# Patient Record
Sex: Male | Born: 1965 | Race: White | Hispanic: No | Smoking: Former smoker
Health system: Southern US, Community
[De-identification: ages and names within clinical notes are randomized; demographics above are authoritative.]

## PROBLEM LIST (undated history)

## (undated) DIAGNOSIS — Z972 Presence of dental prosthetic device (complete) (partial): Secondary | ICD-10-CM

## (undated) DIAGNOSIS — M5126 Other intervertebral disc displacement, lumbar region: Secondary | ICD-10-CM

## (undated) DIAGNOSIS — J329 Chronic sinusitis, unspecified: Secondary | ICD-10-CM

## (undated) DIAGNOSIS — M779 Enthesopathy, unspecified: Secondary | ICD-10-CM

## (undated) DIAGNOSIS — M5412 Radiculopathy, cervical region: Secondary | ICD-10-CM

## (undated) DIAGNOSIS — K219 Gastro-esophageal reflux disease without esophagitis: Secondary | ICD-10-CM

## (undated) DIAGNOSIS — F419 Anxiety disorder, unspecified: Secondary | ICD-10-CM

## (undated) DIAGNOSIS — M1990 Unspecified osteoarthritis, unspecified site: Secondary | ICD-10-CM

## (undated) HISTORY — PX: MICROLARYNGOSCOPY: SHX5208

## (undated) HISTORY — DX: Radiculopathy, cervical region: M54.12

## (undated) HISTORY — PX: WISDOM TOOTH EXTRACTION: SHX21

## (undated) HISTORY — DX: Enthesopathy, unspecified: M77.9

---

## 2011-08-19 HISTORY — PX: UPPER GASTROINTESTINAL ENDOSCOPY: SHX188

## 2015-07-27 ENCOUNTER — Other Ambulatory Visit: Payer: Self-pay | Admitting: Chiropractic Medicine

## 2015-07-27 DIAGNOSIS — M5126 Other intervertebral disc displacement, lumbar region: Secondary | ICD-10-CM

## 2015-07-31 ENCOUNTER — Ambulatory Visit (HOSPITAL_COMMUNITY): Admission: RE | Admit: 2015-07-31 | Payer: BLUE CROSS/BLUE SHIELD | Source: Ambulatory Visit

## 2015-08-01 ENCOUNTER — Encounter: Payer: Self-pay | Admitting: *Deleted

## 2015-08-07 NOTE — Discharge Instructions (Signed)
[facility] °[facility] °ENDOSCOPIC SINUS SURGERY °[facility] ° °What is Functional Endoscopic Sinus Surgery? ° The Surgery involves making the natural openings of the sinuses larger by removing the bony partitions that separate the sinuses from the nasal cavity.  The natural sinus lining is preserved as much as possible to allow the sinuses to resume normal function after the surgery.  In some patients nasal polyps (excessively swollen lining of the sinuses) may be removed to relieve obstruction of the sinus openings.  The surgery is performed through the nose using lighted scopes, which eliminates the need for incisions on the face.  A septoplasty is a different procedure which is sometimes performed with sinus surgery.  It involves straightening the boy partition that separates the two sides of your nose.  A crooked or deviated septum may need repair if is obstructing the sinuses or nasal airflow.  Turbinate reduction is also often performed during sinus surgery.  The turbinates are bony proturberances from the side walls of the nose which swell and can obstruct the nose in patients with sinus and allergy problems.  Their size can be surgically reduced to help relieve nasal obstruction. ° °What Can Sinus Surgery Do For Me? ° Sinus surgery can reduce the frequency of sinus infections requiring antibiotic treatment.  This can provide improvement in nasal congestion, post-nasal drainage, facial pressure and nasal obstruction.  Surgery will NOT prevent you from ever having an infection again, so it usually only for patients who get infections 4 or more times yearly requiring antibiotics, or for infections that do not clear with antibiotics.  It will not cure nasal allergies, so patients with allergies may still require medication to treat their allergies after surgery. Surgery may improve headaches related to sinusitis, however, some people will continue to  require medication to control sinus headaches related to allergies.  Surgery will do nothing for other forms of headache (migraine, tension or cluster). ° °What Are the Risks of Endoscopic Sinus Surgery? ° Current techniques allow surgery to be performed safely with little risk, however, there are rare complications that patients should be aware of.  Because the sinuses are located around the eyes, there is risk of eye injury, including blindness, though again, this would be quite rare. This is usually a result of bleeding behind the eye during surgery, which puts the vision oat risk, though there are treatments to protect the vision and prevent permanent disrupted by surgery causing a leak of the spinal fluid that surrounds the brain.  More serious complications would include bleeding inside the brain cavity or damage to the brain.  Again, all of these complications are uncommon, and spinal fluid leaks can be safely managed surgically if they occur.  The most common complication of sinus surgery is bleeding from the nose, which may require packing or cauterization of the nose.  Continued sinus have polyps may experience recurrence of the polyps requiring revision surgery.  Alterations of sense of smell or injury to the tear ducts are also rare complications.  ° °What is the Surgery Like, and what is the Recovery? ° The Surgery usually takes a couple of hours to perform, and is usually performed under a general anesthetic (completely asleep).  Patients are usually discharged home after a couple of hours.  Sometimes during surgery it is necessary to pack the nose to control bleeding, and the packing is left in place for 24 - 48 hours, and removed by your surgeon.    If a septoplasty was performed during the procedure, there is often a splint placed which must be removed after 5-7 days.   °Discomfort: Pain is usually mild to moderate, and can be controlled by prescription pain medication or acetaminophen (Tylenol).   Aspirin, Ibuprofen (Advil, Motrin), or Naprosyn (Aleve) should be avoided, as they can cause increased bleeding.  Most patients feel sinus pressure like they have a bad head cold for several days.  Sleeping with your head elevated can help reduce swelling and facial pressure, as can ice packs over the face.  A humidifier may be helpful to keep the mucous and blood from drying in the nose.  ° °Diet: There are no specific diet restrictions, however, you should generally start with clear liquids and a light diet of bland foods because the anesthetic can cause some nausea.  Advance your diet depending on how your stomach feels.  Taking your pain medication with food will often help reduce stomach upset which pain medications can cause. ° °Nasal Saline Irrigation: It is important to remove blood clots and dried mucous from the nose as it is healing.  This is done by having you irrigate the nose at least 3 - 4 times daily with a salt water solution.  We recommend using NeilMed Sinus Rinse (available at the drug store).  Fill the squeeze bottle with the solution, bend over a sink, and insert the tip of the squeeze bottle into the nose ½ of an inch.  Point the tip of the squeeze bottle towards the inside corner of the eye on the same side your irrigating.  Squeeze the bottle and gently irrigate the nose.  If you bend forward as you do this, most of the fluid will flow back out of the nose, instead of down your throat.   The solution should be warm, near body temperature, when you irrigate.   Each time you irrigate, you should use a full squeeze bottle.  ° °Note that if you are instructed to use Nasal Steroid Sprays at any time after your surgery, irrigate with saline BEFORE using the steroid spray, so you do not wash it all out of the nose. °Another product, Nasal Saline Gel (such as AYR Nasal Saline Gel) can be applied in each nostril 3 - 4 times daily to moisture the nose and reduce scabbing or crusting. ° °Bleeding:   Bloody drainage from the nose can be expected for several days, and patients are instructed to irrigate their nose frequently with salt water to help remove mucous and blood clots.  The drainage may be dark red or brown, though some fresh blood may be seen intermittently, especially after irrigation.  Do not blow you nose, as bleeding may occur. If you must sneeze, keep your mouth open to allow air to escape through your mouth. ° °If heavy bleeding occurs: Irrigate the nose with saline to rinse out clots, then spray the nose 3 - 4 times with Afrin Nasal Decongestant Spray.  The spray will constrict the blood vessels to slow bleeding.  Pinch the lower half of your nose shut to apply pressure, and lay down with your head elevated.  Ice packs over the nose may help as well. If bleeding persists despite these measures, you should notify your doctor.  Do not use the Afrin routinely to control nasal congestion after surgery, as it can result in worsening congestion and may affect healing.  ° ° ° °Activity: Return to work varies among patients. Most patients will be   out of work at least 5 - 7 days to recover.  Patient may return to work after they are off of narcotic pain medication, and feeling well enough to perform the functions of their job.  Patients must avoid heavy lifting (over 10 pounds) or strenuous physical for 2 weeks after surgery, so your employer may need to assign you to light duty, or keep you out of work longer if light duty is not possible.  NOTE: you should not drive, operate dangerous machinery, do any mentally demanding tasks or make any important legal or financial decisions while on narcotic pain medication and recovering from the general anesthetic.  °  °Call Your Doctor Immediately if You Have Any of the Following: °1. Bleeding that you cannot control with the above measures °2. Loss of vision, double vision, bulging of the eye or black eyes. °3. Fever over 101 degrees °4. Neck stiffness with  severe headache, fever, nausea and change in mental state. °You are always encourage to call anytime with concerns, however, please call with requests for pain medication refills during office hours. ° °Office Endoscopy: During follow-up visits your doctor will remove any packing or splints that may have been placed and evaluate and clean your sinuses endoscopically.  Topical anesthetic will be used to make this as comfortable as possible, though you may want to take your pain medication prior to the visit.  How often this will need to be done varies from patient to patient.  After complete recovery from the surgery, you may need follow-up endoscopy from time to time, particularly if there is concern of recurrent infection or nasal polyps. ° °General Anesthesia, Adult, Care After °Refer to this sheet in the next few weeks. These instructions provide you with information on caring for yourself after your procedure. Your health care provider may also give you more specific instructions. Your treatment has been planned according to current medical practices, but problems sometimes occur. Call your health care provider if you have any problems or questions after your procedure. °WHAT TO EXPECT AFTER THE PROCEDURE °After the procedure, it is typical to experience: °· Sleepiness. °· Nausea and vomiting. °HOME CARE INSTRUCTIONS °· For the first 24 hours after general anesthesia: °¨ Have a responsible person with you. °¨ Do not drive a car. If you are alone, do not take public transportation. °¨ Do not drink alcohol. °¨ Do not take medicine that has not been prescribed by your health care provider. °¨ Do not sign important papers or make important decisions. °¨ You may resume a normal diet and activities as directed by your health care provider. °· Change bandages (dressings) as directed. °· If you have questions or problems that seem related to general anesthesia, call the hospital and ask for the anesthetist or  anesthesiologist on call. °SEEK MEDICAL CARE IF: °· You have nausea and vomiting that continue the day after anesthesia. °· You develop a rash. °SEEK IMMEDIATE MEDICAL CARE IF:  °· You have difficulty breathing. °· You have chest pain. °· You have any allergic problems. °  °This information is not intended to replace advice given to you by your health care provider. Make sure you discuss any questions you have with your health care provider. °  °Document Released: 11/10/2000 Document Revised: 08/25/2014 Document Reviewed: 12/03/2011 °Elsevier Interactive Patient Education ©2016 [employer] ° °

## 2015-08-08 ENCOUNTER — Ambulatory Visit: Payer: BLUE CROSS/BLUE SHIELD | Admitting: Anesthesiology

## 2015-08-08 ENCOUNTER — Ambulatory Visit
Admission: RE | Admit: 2015-08-08 | Discharge: 2015-08-08 | Disposition: A | Discharge status: Home / Self Care | Payer: BLUE CROSS/BLUE SHIELD | Source: Ambulatory Visit | Attending: Otolaryngology | Admitting: Otolaryngology

## 2015-08-08 ENCOUNTER — Encounter
Admission: RE | Disposition: A | Discharge status: Home / Self Care | Payer: Self-pay | Source: Ambulatory Visit | Attending: Otolaryngology

## 2015-08-08 ENCOUNTER — Encounter: Payer: Self-pay | Admitting: *Deleted

## 2015-08-08 DIAGNOSIS — J329 Chronic sinusitis, unspecified: Secondary | ICD-10-CM | POA: Diagnosis not present

## 2015-08-08 DIAGNOSIS — J342 Deviated nasal septum: Secondary | ICD-10-CM | POA: Insufficient documentation

## 2015-08-08 DIAGNOSIS — J343 Hypertrophy of nasal turbinates: Secondary | ICD-10-CM | POA: Insufficient documentation

## 2015-08-08 DIAGNOSIS — K219 Gastro-esophageal reflux disease without esophagitis: Secondary | ICD-10-CM | POA: Insufficient documentation

## 2015-08-08 DIAGNOSIS — J338 Other polyp of sinus: Secondary | ICD-10-CM | POA: Diagnosis not present

## 2015-08-08 DIAGNOSIS — J3489 Other specified disorders of nose and nasal sinuses: Secondary | ICD-10-CM | POA: Diagnosis not present

## 2015-08-08 DIAGNOSIS — F17200 Nicotine dependence, unspecified, uncomplicated: Secondary | ICD-10-CM | POA: Diagnosis not present

## 2015-08-08 HISTORY — DX: Gastro-esophageal reflux disease without esophagitis: K21.9

## 2015-08-08 HISTORY — DX: Presence of dental prosthetic device (complete) (partial): Z97.2

## 2015-08-08 HISTORY — PX: MAXILLARY ANTROSTOMY: SHX2003

## 2015-08-08 HISTORY — PX: FRONTAL SINUS EXPLORATION: SHX6591

## 2015-08-08 HISTORY — PX: ETHMOIDECTOMY: SHX5197

## 2015-08-08 HISTORY — PX: NASAL TURBINATE REDUCTION: SHX2072

## 2015-08-08 HISTORY — DX: Unspecified osteoarthritis, unspecified site: M19.90

## 2015-08-08 HISTORY — PX: IMAGE GUIDED SINUS SURGERY: SHX6570

## 2015-08-08 HISTORY — PX: SPHENOIDECTOMY: SHX2421

## 2015-08-08 HISTORY — PX: SEPTOPLASTY: SHX2393

## 2015-08-08 SURGERY — SINUS SURGERY, WITH IMAGING GUIDANCE
Anesthesia: General | Site: Nose | Wound class: Clean Contaminated

## 2015-08-08 MED ORDER — HYDROMORPHONE HCL 1 MG/ML IJ SOLN: 0.2500 mg | INTRAMUSCULAR | Status: DC | PRN

## 2015-08-08 MED ORDER — ACETAMINOPHEN 325 MG PO TABS: 325.0000 mg | ORAL_TABLET | ORAL | Status: DC | PRN

## 2015-08-08 MED ORDER — SUCCINYLCHOLINE CHLORIDE 20 MG/ML IJ SOLN
INTRAMUSCULAR | Status: DC | PRN
  Administered 2015-08-08: 100 mg via INTRAVENOUS

## 2015-08-08 MED ORDER — ACETAMINOPHEN 160 MG/5ML PO SOLN: 325.0000 mg | ORAL | Status: DC | PRN

## 2015-08-08 MED ORDER — HYDROCODONE-ACETAMINOPHEN 5-325 MG PO TABS: 1.0000 | ORAL_TABLET | ORAL | Status: DC | PRN

## 2015-08-08 MED ORDER — GLYCOPYRROLATE 0.2 MG/ML IJ SOLN
INTRAMUSCULAR | Status: DC | PRN
  Administered 2015-08-08: .1 mg via INTRAVENOUS

## 2015-08-08 MED ORDER — LIDOCAINE-EPINEPHRINE 1 %-1:100000 IJ SOLN
INTRAMUSCULAR | Status: DC | PRN
  Administered 2015-08-08: 7 mL

## 2015-08-08 MED ORDER — ACETAMINOPHEN 10 MG/ML IV SOLN
1000.0000 mg | Freq: Once | INTRAVENOUS | Status: AC
  Administered 2015-08-08: 1000 mg via INTRAVENOUS

## 2015-08-08 MED ORDER — DEXAMETHASONE SODIUM PHOSPHATE 4 MG/ML IJ SOLN
INTRAMUSCULAR | Status: DC | PRN
  Administered 2015-08-08: 10 mg via INTRAVENOUS

## 2015-08-08 MED ORDER — CEFAZOLIN SODIUM-DEXTROSE 2-3 GM-% IV SOLR
2.0000 g | Freq: Once | INTRAVENOUS | Status: AC
  Administered 2015-08-08: 2 g via INTRAVENOUS

## 2015-08-08 MED ORDER — MIDAZOLAM HCL 5 MG/5ML IJ SOLN
INTRAMUSCULAR | Status: DC | PRN
  Administered 2015-08-08: 2 mg via INTRAVENOUS

## 2015-08-08 MED ORDER — SULFAMETHOXAZOLE-TRIMETHOPRIM 800-160 MG PO TABS: 1.0000 | ORAL_TABLET | Freq: Two times a day (BID) | ORAL | Status: DC

## 2015-08-08 MED ORDER — FENTANYL CITRATE (PF) 100 MCG/2ML IJ SOLN
INTRAMUSCULAR | Status: DC | PRN
  Administered 2015-08-08: 25 ug via INTRAVENOUS
  Administered 2015-08-08: 100 ug via INTRAVENOUS
  Administered 2015-08-08: 50 ug via INTRAVENOUS
  Administered 2015-08-08: 25 ug via INTRAVENOUS
  Administered 2015-08-08: 50 ug via INTRAVENOUS
  Administered 2015-08-08 (×2): 25 ug via INTRAVENOUS

## 2015-08-08 MED ORDER — ALBUTEROL SULFATE HFA 108 (90 BASE) MCG/ACT IN AERS
INHALATION_SPRAY | RESPIRATORY_TRACT | Status: DC | PRN
  Administered 2015-08-08: 2 via RESPIRATORY_TRACT

## 2015-08-08 MED ORDER — OXYCODONE HCL 5 MG/5ML PO SOLN: 5.0000 mg | Freq: Once | ORAL | Status: AC | PRN

## 2015-08-08 MED ORDER — ONDANSETRON HCL 4 MG/2ML IJ SOLN
4.0000 mg | Freq: Once | INTRAMUSCULAR | Status: AC | PRN
  Administered 2015-08-08: 4 mg via INTRAVENOUS

## 2015-08-08 MED ORDER — LACTATED RINGERS IV SOLN: 500.0000 mL | INTRAVENOUS | Status: DC

## 2015-08-08 MED ORDER — OXYMETAZOLINE HCL 0.05 % NA SOLN
NASAL | Status: DC | PRN
  Administered 2015-08-08: 1 via TOPICAL

## 2015-08-08 MED ORDER — PROMETHAZINE HCL 12.5 MG PO TABS: 12.5000 mg | ORAL_TABLET | Freq: Four times a day (QID) | ORAL | Status: DC | PRN

## 2015-08-08 MED ORDER — OXYCODONE HCL 5 MG PO TABS
5.0000 mg | ORAL_TABLET | Freq: Once | ORAL | Status: AC | PRN
  Administered 2015-08-08: 5 mg via ORAL

## 2015-08-08 MED ORDER — LACTATED RINGERS IV SOLN
INTRAVENOUS | Status: DC
  Administered 2015-08-08 (×2): via INTRAVENOUS

## 2015-08-08 MED ORDER — EPHEDRINE SULFATE 50 MG/ML IJ SOLN
INTRAMUSCULAR | Status: DC | PRN
  Administered 2015-08-08 (×2): 5 mg via INTRAVENOUS

## 2015-08-08 MED ORDER — ONDANSETRON HCL 4 MG/2ML IJ SOLN
INTRAMUSCULAR | Status: DC | PRN
  Administered 2015-08-08: 4 mg via INTRAVENOUS

## 2015-08-08 MED ORDER — LIDOCAINE HCL (CARDIAC) 20 MG/ML IV SOLN
INTRAVENOUS | Status: DC | PRN
  Administered 2015-08-08: 40 mg via INTRAVENOUS

## 2015-08-08 MED ORDER — ONDANSETRON 4 MG PO TBDP
4.0000 mg | ORAL_TABLET | Freq: Once | ORAL | Status: AC
  Administered 2015-08-08: 4 mg via ORAL

## 2015-08-08 MED ORDER — PROPOFOL 10 MG/ML IV BOLUS
INTRAVENOUS | Status: DC | PRN
  Administered 2015-08-08: 200 mg via INTRAVENOUS
  Administered 2015-08-08: 100 mg via INTRAVENOUS

## 2015-08-08 SURGICAL SUPPLY — 37 items
BALLOON SINUPLASTY SYSTEM (BALLOONS) ×4 IMPLANT
BATTERY INSTRU NAVIGATION (MISCELLANEOUS) ×16 IMPLANT
BLADE IRRIGATOR 40D CVD (IRRIGATION / IRRIGATOR) IMPLANT
CANISTER SUCT 1200ML W/VALVE (MISCELLANEOUS) ×4 IMPLANT
COAG SUCT 10F 3.5MM HAND CTRL (MISCELLANEOUS) ×4 IMPLANT
DEVICE INFLATION SEID (MISCELLANEOUS) ×4 IMPLANT
DRAPE HEAD BAR (DRAPES) ×4 IMPLANT
DRESSING NASL FOAM PST OP SINU (MISCELLANEOUS) ×3 IMPLANT
DRSG NASAL FOAM POST OP SINU (MISCELLANEOUS) ×4
GLOVE BIO SURGEON STRL SZ7.5 (GLOVE) ×12 IMPLANT
IRRIGATOR 4MM STR (IRRIGATION / IRRIGATOR) ×4 IMPLANT
IV NS 500ML (IV SOLUTION) ×1
IV NS 500ML BAXH (IV SOLUTION) ×3 IMPLANT
KIT ROOM TURNOVER OR (KITS) ×4 IMPLANT
NAVIGATION MASK REG  ST (MISCELLANEOUS) ×4 IMPLANT
NEEDLE HYPO 25GX1X1/2 BEV (NEEDLE) ×4 IMPLANT
NS IRRIG 500ML POUR BTL (IV SOLUTION) ×4 IMPLANT
PACK DRAPE NASAL/ENT (PACKS) ×4 IMPLANT
PACKING NASAL EPIS 4X2.4 XEROG (MISCELLANEOUS) ×8 IMPLANT
PAD GROUND ADULT SPLIT (MISCELLANEOUS) ×4 IMPLANT
PATTIES SURGICAL .5 X3 (DISPOSABLE) ×8 IMPLANT
SET HANDPIECE IRR DIEGO (MISCELLANEOUS) ×4 IMPLANT
SOL ANTI-FOG 6CC FOG-OUT (MISCELLANEOUS) ×3 IMPLANT
SOL FOG-OUT ANTI-FOG 6CC (MISCELLANEOUS) ×1
SPLINT NASAL SEPTAL PRE-CUT (MISCELLANEOUS) ×4 IMPLANT
STRAP BODY AND KNEE 60X3 (MISCELLANEOUS) ×8 IMPLANT
SUT CHROMIC 4 0 RB 1X27 (SUTURE) ×4 IMPLANT
SUT ETHILON 3-0 FS-10 30 BLK (SUTURE)
SUT ETHILON 4-0 (SUTURE)
SUT ETHILON 4-0 FS2 18XMFL BLK (SUTURE)
SUT PLAIN GUT 4-0 (SUTURE) ×4 IMPLANT
SUTURE EHLN 3-0 FS-10 30 BLK (SUTURE) IMPLANT
SUTURE ETHLN 4-0 FS2 18XMF BLK (SUTURE) IMPLANT
SYR BULB EAR ULCER 3OZ GRN STR (SYRINGE) ×4 IMPLANT
SYRINGE 10CC LL (SYRINGE) ×4 IMPLANT
TOWEL OR 17X26 4PK STRL BLUE (TOWEL DISPOSABLE) ×4 IMPLANT
WATER STERILE IRR 500ML POUR (IV SOLUTION) ×4 IMPLANT

## 2015-08-08 NOTE — Transfer of Care (Signed)
Immediate Anesthesia Transfer of Care Note  Patient: David Kennedy  Procedure(s) Performed: Procedure(s) with comments: IMAGE GUIDED SINUS SURGERY (N/A) - GAVE DISK TO CE[facility] 12/8 SEPTOPLASTY (N/A) MAXILLARY ANTROSTOMY (Bilateral) FRONTAL SINUS EXPLORATION (Left) TOTAL ETHMOIDECTOMY (Bilateral) SPHENOIDECTOMY (Left) TURBINATE REDUCTION/SUBMUCOSAL RESECTION (Bilateral)  Patient Location: PACU  Anesthesia Type: General  Level of Consciousness: awake, alert  and patient cooperative  Airway and Oxygen Therapy: Patient Spontanous Breathing and Patient connected to supplemental oxygen  Post-op Assessment: Post-op Vital signs reviewed, Patient's Cardiovascular Status Stable, Respiratory Function Stable, Patent Airway and No signs of Nausea or vomiting  Post-op Vital Signs: Reviewed and stable  Complications: No apparent anesthesia complications

## 2015-08-08 NOTE — H&P (Signed)
..  History and Physical paper copy reviewed and updated date of procedure and will be scanned into system.  

## 2015-08-08 NOTE — Anesthesia Preprocedure Evaluation (Signed)
Anesthesia Evaluation  Patient identified by MRN, date of birth, ID band Patient awake    Reviewed: Allergy & Precautions, H&P , NPO status , Patient's Chart, lab work & pertinent test results, reviewed documented beta blocker date and time   History of Anesthesia Complications Negative for: history of anesthetic complications  Airway Mallampati: II  TM Distance: >3 FB Neck ROM: full    Dental no notable dental hx.    Pulmonary Current Smoker,    Pulmonary exam normal breath sounds clear to auscultation       Cardiovascular Exercise Tolerance: Good negative cardio ROS   Rhythm:regular Rate:Normal     Neuro/Psych negative neurological ROS  negative psych ROS   GI/Hepatic Neg liver ROS, GERD  Medicated,  Endo/Other  negative endocrine ROS  Renal/GU negative Renal ROS  negative genitourinary   Musculoskeletal   Abdominal   Peds  Hematology negative hematology ROS (+)   Anesthesia Other Findings   Reproductive/Obstetrics negative OB ROS                             Anesthesia Physical Anesthesia Plan  ASA: II  Anesthesia Plan: General   Post-op Pain Management:    Induction:   Airway Management Planned:   Additional Equipment:   Intra-op Plan:   Post-operative Plan:   Informed Consent: I have reviewed the patients History and Physical, chart, labs and discussed the procedure including the risks, benefits and alternatives for the proposed anesthesia with the patient or authorized representative who has indicated his/her understanding and acceptance.     Plan Discussed with: CRNA  Anesthesia Plan Comments:         Anesthesia Quick Evaluation

## 2015-08-08 NOTE — Anesthesia Postprocedure Evaluation (Signed)
Anesthesia Post Note  Patient: Beatrix Fetters  Procedure(s) Performed: Procedure(s) (LRB): IMAGE GUIDED SINUS SURGERY (N/A) SEPTOPLASTY (N/A) MAXILLARY ANTROSTOMY (Bilateral) FRONTAL SINUS EXPLORATION (Left) TOTAL ETHMOIDECTOMY (Bilateral) SPHENOIDECTOMY (Left) TURBINATE REDUCTION/SUBMUCOSAL RESECTION (Bilateral)  Patient location during evaluation: PACU Anesthesia Type: General Level of consciousness: awake and alert Pain management: pain level controlled Vital Signs Assessment: post-procedure vital signs reviewed and stable Respiratory status: spontaneous breathing, nonlabored ventilation, respiratory function stable and patient connected to nasal cannula oxygen Cardiovascular status: blood pressure returned to baseline and stable Postop Assessment: no signs of nausea or vomiting Anesthetic complications: no    DANIEL D KOVACS

## 2015-08-08 NOTE — Addendum Note (Signed)
Addendum  created 08/08/15 1302 by Mayme Genta, CRNA   Modules edited: Anesthesia Medication Administration

## 2015-08-08 NOTE — Addendum Note (Signed)
Addendum  created 08/08/15 1402 by Esmeralda Links, MD   Modules edited: Orders

## 2015-08-08 NOTE — Anesthesia Procedure Notes (Addendum)
Procedure Name: Intubation Date/Time: 08/08/2015 9:35 AM Performed by: Mayme Genta Pre-anesthesia Checklist: Patient identified, Emergency Drugs available, Suction available, Patient being monitored and Timeout performed Patient Re-evaluated:Patient Re-evaluated prior to inductionOxygen Delivery Method: Circle system utilized Preoxygenation: Pre-oxygenation with 100% oxygen Intubation Type: IV induction Ventilation: Mask ventilation without difficulty Laryngoscope Size: Miller and 3 Grade View: Grade I Tube type: Oral Rae Tube size: 7.5 mm Number of attempts: 1 Placement Confirmation: ETT inserted through vocal cords under direct vision,  positive ETCO2 and breath sounds checked- equal and bilateral Tube secured with: Tape Dental Injury: Teeth and Oropharynx as per pre-operative assessment

## 2015-08-08 NOTE — Progress Notes (Signed)
Pt vomited approx 264ml of blood tinged fluid. Pt already received a total of 8mg  of zofran, Dr. Charlestine Night aware, MD verbalizes he would like to give another 4mg  of po zofran. Will admin & continue to monitor.

## 2015-08-08 NOTE — Op Note (Signed)
..08/08/2015  12:39 PM    Beatrix Fetters  [id number]    Pre-Op Dx:  Deviated Nasal Septum, Hypertrophic Inferior Turbinates, Chronic Sinusitis, Nasal Obstruction  Post-op Dx: Same  Proc: 1)    Image Guidance Sinus Surgery  2)  Bilateral Maxillary Antrostomy  3)  Bilateral Total Ethmoidectomy  4)  Left Frontal Sinusotomy  5)  Left Sphenoidotomy  6)  Nasal Septoplasty  7)  Bilateral Partial Reduction Inferior Turbinates   Surg:  Keyasia Jolliff  Anes:  GOT  EBL:  75  Comp:  None  Findings: Severe bilateral septal spurs with left spur impaction onto posterior inferior turbinate, Bilateral wide maxillary antrostomies, bilateral total ethmoidectomies with 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  The patient received preoperative Afrin spray for topical decongestion and vasoconstriction.  At an appropriate level, the patient was placed in a semi-sitting position.  Nasal vibrissae were trimmed.   1% Xylocaine with 1:100,000 epinephrine, 5 cc's, was infiltrated into the anterior floor of the nose, into the nasal spine region, into the membranous columella, and finally into the submucoperichondrial plane of the septum on both sides.  Several minutes were allowed for this to take effect.  Cottoniod pledgetts soaked in Afrin were placed into both nasal cavities and left while the patient was prepped and draped in the standard fashion.   A proper time-out was performed.  The Stryker image guidance system was set up and calibrated in the normal fashion with an acceptable error of 0.68mm.   The materials were removed from the nose and observed to be intact and correct in number.  The nose was inspected with a headlight and zero degree endoscope with the findings as described above.  The inferior turbinates were then  inspected.  Under endoscopic visualization, the inferior turbinates were infractured bilaterally with a Soil scientist.  A kelly clamp was attached to the anterior-inferior third of each inferior turbinate for approximately one minute.  Under endoscopic visualization, Tru-cutting forceps were used to remove the anterior-inferior third of each inferior turbinate.  Electrocautery was used to control bleeding in the area. The remaining turbinate was then outfractured to open up the airway further. There was no significant bleeding noted. The right turbinate was then trimmed and outfractured in a similar fashion.     At this point, attention was directed to the functional endoscopic sinus surgery aspect of the procedure.  The nose was next inspected with a zero degree endoscope and the middle turbinates were medialized and afrin soaked pledgets were placed lateral to the turbinates for approximately one minute.  The uncinate process was infractured with a maxillary ostia seeker.  The Acclarent balloon sinuplasty device was next placed behind the uncinate process and the guide wire was threaded into the left frontal sinus with proper transillumination.  The sinus tract was then dilated 3X for a patent frontal sinus.  At this time a currette and Giraffe probe was used to remove the fractured ager nasi cells for a widely patient frontal sinus outflow tract.  At this time attention was directed to the patient's maxillary sinuses.  On the left, a ball tipped probe was placed through the natural ostia and this was used to create a larger opening.  Using a Diego microdebrider, the maxillary antrostomy was enlarged for a widely patent maxillary antrostomy.  This was repeated in a simlar fashion on the patient's  right side.  Hemostasis was performed with topical Afrin soaked pledgets.  Visualization with a 30 degree endoscope was used to examine the bilateral maxillary antrostomies which were noted to be widely patent and  in continuity with the natural os bilaterally.  Next attention was directed to the patient's ethmoid sinuses.  The left ethmoid bulla was entered with a straight image guidance suction.  The ethmoid cells were opened from a medial and inferior position superior and laterally until the vertical lamella was encountered.  This was opened and the posterior ethmoid was opened in a similar fashion.  All fragments of bone and mucosa were removed with straight biting forceps.  The skull base was identified and trauma was avoided.  Image guidance was used throughout to ensure all diseased air cells were opened.  This was repeated on the patient's right side in a similar fashion with all ethmoid air cells opened bilaterally.  Attention at this time was directed to the patient's sphenoid sinuses.  On the patient's left side, the middle turbinate was lateralized and the superior turbinate was identified.  The sphenoid os was visualized and the os was sequentially dilated with an acclarent balloon device and then elarged with 45 degree biting forceps and mushroom punch.  Polypoid mucosa was noted near the os.  At this time all diseased sinuses were opened and attention was directed to the patient's septoplasty.  A left Killian incision was sharply executed and carried down to the caudal edge of the quadrangular cartilage with a 15 blade scapel.  A mucoperichondrial flap was elelvated along the quadrangular plate back to the bony-cartilaginous junction using caudal elevator and freer elevator. The mucoperiostium was then elevated along the ethmoid plate and the vomer. An itracartilagenous incision was made using the freer elevator and a contralateral mucoperichondiral flap was elevated using a freer elevator.  Care was taken to avoid any large rents or opposing rents in the mucoperichondrial flap.  Boney spurs of the vomer and maxillary crest were removed with Takahashi forceps.  The area of cartilagenous deviation was  removed with combination of freer elevator and Takahashi forceps creating a widely patent nasal cavity as well as resolution of obstruction from the cartilagenous deviation. The mucosal flaps were placed back into their anatomic position to allow visualization of the airways. The septum now sat in the midline with an improved airway.  A 4-0 Chromic was used to close the [place] incision as well.   At this time with all sinuses opened, the patient's nasal cavity was examinated and copiously irrigated with sterile saline.  Meticulous hemostasis was continued and all sinuses were examined and noted to be widely patent.    There was no signifcant bleeding. Nasal splints were applied to both sides of the septum using Xomed 0.65mm regular sized splints that were trimmed, and then held in position with a 3-0 Nylon through and through suture.  Stamberger sinufoam was placed along the cut edge of the inferior turbinates bilaterally as well as into posterior ethmoids.  Xerogel was placed lateral to the middle turbinate bilateral for proper medialization.  Stamberger was used to inflate the xerogel bilaterally.  The patient was turned back over to anesthesia, and awakened, extubated, and taken to the PACU in satisfactory condition.  Dispo:   PACU to home  Plan: Ice, elevation, narcotic analgesia, steroid taper, and prophylactic antibiotics for the duration of indwelling nasal foreign bodies.  We will reevaluate the patient in the office in 7 days and remove the septal  splints.  Return to work in 10 days, strenuous activities in two weeks.   Alonnie Bieker 08/08/2015 12:39 PM

## 2015-08-09 ENCOUNTER — Encounter: Payer: Self-pay | Admitting: Otolaryngology

## 2015-08-10 LAB — SURGICAL PATHOLOGY

## 2015-10-24 ENCOUNTER — Ambulatory Visit: Payer: BLUE CROSS/BLUE SHIELD | Attending: Otolaryngology

## 2015-10-24 DIAGNOSIS — F5101 Primary insomnia: Secondary | ICD-10-CM | POA: Insufficient documentation

## 2015-10-24 DIAGNOSIS — R0683 Snoring: Secondary | ICD-10-CM | POA: Insufficient documentation

## 2015-12-31 DIAGNOSIS — J209 Acute bronchitis, unspecified: Secondary | ICD-10-CM | POA: Diagnosis not present

## 2016-04-14 DIAGNOSIS — R030 Elevated blood-pressure reading, without diagnosis of hypertension: Secondary | ICD-10-CM | POA: Diagnosis not present

## 2016-04-14 DIAGNOSIS — J9801 Acute bronchospasm: Secondary | ICD-10-CM | POA: Diagnosis not present

## 2016-04-14 DIAGNOSIS — J069 Acute upper respiratory infection, unspecified: Secondary | ICD-10-CM | POA: Diagnosis not present

## 2016-05-25 DIAGNOSIS — J209 Acute bronchitis, unspecified: Secondary | ICD-10-CM | POA: Diagnosis not present

## 2016-05-25 DIAGNOSIS — F17210 Nicotine dependence, cigarettes, uncomplicated: Secondary | ICD-10-CM | POA: Diagnosis not present

## 2016-05-30 DIAGNOSIS — J189 Pneumonia, unspecified organism: Secondary | ICD-10-CM | POA: Diagnosis not present

## 2016-07-05 ENCOUNTER — Encounter: Payer: Self-pay | Admitting: Internal Medicine

## 2016-07-07 ENCOUNTER — Ambulatory Visit (INDEPENDENT_AMBULATORY_CARE_PROVIDER_SITE_OTHER): Payer: BLUE CROSS/BLUE SHIELD | Admitting: Internal Medicine

## 2016-07-07 ENCOUNTER — Encounter: Payer: Self-pay | Admitting: Internal Medicine

## 2016-07-07 ENCOUNTER — Other Ambulatory Visit: Payer: Self-pay | Admitting: Internal Medicine

## 2016-07-07 ENCOUNTER — Ambulatory Visit
Admission: RE | Admit: 2016-07-07 | Discharge: 2016-07-07 | Disposition: A | Discharge status: Home / Self Care | Payer: BLUE CROSS/BLUE SHIELD | Source: Ambulatory Visit | Attending: Internal Medicine | Admitting: Internal Medicine

## 2016-07-07 VITALS — BP 138/72 | HR 74 | Temp 98.2°F | Resp 16 | Ht 69.0 in | Wt 236.0 lb

## 2016-07-07 DIAGNOSIS — F17200 Nicotine dependence, unspecified, uncomplicated: Secondary | ICD-10-CM | POA: Diagnosis not present

## 2016-07-07 DIAGNOSIS — J189 Pneumonia, unspecified organism: Secondary | ICD-10-CM

## 2016-07-07 DIAGNOSIS — K219 Gastro-esophageal reflux disease without esophagitis: Secondary | ICD-10-CM | POA: Insufficient documentation

## 2016-07-07 DIAGNOSIS — F411 Generalized anxiety disorder: Secondary | ICD-10-CM | POA: Insufficient documentation

## 2016-07-07 MED ORDER — PAROXETINE HCL ER 25 MG PO TB24: 25.0000 mg | ORAL_TABLET | Freq: Every day | ORAL | 5 refills | Status: DC

## 2016-07-07 MED ORDER — DEXLANSOPRAZOLE 60 MG PO CPDR
60.0000 mg | DELAYED_RELEASE_CAPSULE | Freq: Every day | ORAL | 0 refills | Status: DC
Start: 2016-07-07 — End: 2016-07-14

## 2016-07-07 NOTE — Patient Instructions (Signed)
Dexilant 60 mg once a day in place of protonix   Steps to Quit Smoking Smoking tobacco can be harmful to your health and can affect almost every organ in your body. Smoking puts you, and those around you, at risk for developing many serious chronic diseases. Quitting smoking is difficult, but it is one of the best things that you can do for your health. It is never too late to quit. What are the benefits of quitting smoking? When you quit smoking, you lower your risk of developing serious diseases and conditions, such as:  Lung cancer or lung disease, such as COPD.  Heart disease.  Stroke.  Heart attack.  Infertility.  Osteoporosis and bone fractures. Additionally, symptoms such as coughing, wheezing, and shortness of breath may get better when you quit. You may also find that you get sick less often because your body is stronger at fighting off colds and infections. If you are pregnant, quitting smoking can help to reduce your chances of having a baby of low birth weight. How do I get ready to quit? When you decide to quit smoking, create a plan to make sure that you are successful. Before you quit:  Pick a date to quit. Set a date within the next two weeks to give you time to prepare.  Write down the reasons why you are quitting. Keep this list in places where you will see it often, such as on your bathroom mirror or in your car or wallet.  Identify the people, places, things, and activities that make you want to smoke (triggers) and avoid them. Make sure to take these actions:  Throw away all cigarettes at home, at work, and in your car.  Throw away smoking accessories, such as Scientist, research (medical).  Clean your car and make sure to empty the ashtray.  Clean your home, including curtains and carpets.  Tell your family, friends, and coworkers that you are quitting. Support from your loved ones can make quitting easier.  Talk with your health care provider about your options  for quitting smoking.  Find out what treatment options are covered by your health insurance. What strategies can I use to quit smoking? Talk with your healthcare provider about different strategies to quit smoking. Some strategies include:  Quitting smoking altogether instead of gradually lessening how much you smoke over a period of time. Research shows that quitting "cold [place]" is more successful than gradually quitting.  Attending in-person counseling to help you build problem-solving skills. You are more likely to have success in quitting if you attend several counseling sessions. Even short sessions of 10 minutes can be effective.  Finding resources and support systems that can help you to quit smoking and remain smoke-free after you quit. These resources are most helpful when you use them often. They can include:  Online chats with a Social worker.  Telephone quitlines.  Printed Furniture conservator/restorer.  Support groups or group counseling.  Text messaging programs.  Mobile phone applications.  Taking medicines to help you quit smoking. (If you are pregnant or breastfeeding, talk with your health care provider first.) Some medicines contain nicotine and some do not. Both types of medicines help with cravings, but the medicines that include nicotine help to relieve withdrawal symptoms. Your health care provider may recommend:  Nicotine patches, gum, or lozenges.  Nicotine inhalers or sprays.  Non-nicotine medicine that is taken by mouth. Talk with your health care provider about combining strategies, such as taking medicines while you are  also receiving in-person counseling. Using these two strategies together makes you more likely to succeed in quitting than if you used either strategy on its own. If you are pregnant or breastfeeding, talk with your health care provider about finding counseling or other support strategies to quit smoking. Do not take medicine to help you quit smoking  unless told to do so by your health care provider. What things can I do to make it easier to quit? Quitting smoking might feel overwhelming at first, but there is a lot that you can do to make it easier. Take these important actions:  Reach out to your family and friends and ask that they support and encourage you during this time. Call telephone quitlines, reach out to support groups, or work with a counselor for support.  Ask people who smoke to avoid smoking around you.  Avoid places that trigger you to smoke, such as bars, parties, or smoke-break areas at work.  Spend time around people who do not smoke.  Lessen stress in your life, because stress can be a smoking trigger for some people. To lessen stress, try:  Exercising regularly.  Deep-breathing exercises.  Yoga.  Meditating.  Performing a body scan. This involves closing your eyes, scanning your body from head to toe, and noticing which parts of your body are particularly tense. Purposefully relax the muscles in those areas.  Download or purchase mobile phone or tablet apps (applications) that can help you stick to your quit plan by providing reminders, tips, and encouragement. There are many free apps, such as QuitGuide from the [employer] Office manager for Disease Control and Prevention). You can find other support for quitting smoking (smoking cessation) through smokefree.gov and other websites. How will I feel when I quit smoking? Within the first 24 hours of quitting smoking, you may start to feel some withdrawal symptoms. These symptoms are usually most noticeable 2-3 days after quitting, but they usually do not last beyond 2-3 weeks. Changes or symptoms that you might experience include:  Mood swings.  Restlessness, anxiety, or irritation.  Difficulty concentrating.  Dizziness.  Strong cravings for sugary foods in addition to nicotine.  Mild weight gain.  Constipation.  Nausea.  Coughing or a sore throat.  Changes in  how your medicines work in your body.  A depressed mood.  Difficulty sleeping (insomnia). After the first 2-3 weeks of quitting, you may start to notice more positive results, such as:  Improved sense of smell and taste.  Decreased coughing and sore throat.  Slower heart rate.  Lower blood pressure.  Clearer skin.  The ability to breathe more easily.  Fewer sick days. Quitting smoking is very challenging for most people. Do not get discouraged if you are not successful the first time. Some people need to make many attempts to quit before they achieve long-term success. Do your best to stick to your quit plan, and talk with your health care provider if you have any questions or concerns. This information is not intended to replace advice given to you by your health care provider. Make sure you discuss any questions you have with your health care provider. Document Released: 07/29/2001 Document Revised: 04/01/2016 Document Reviewed: 12/19/2014 Elsevier Interactive Patient Education  2017 [employer].

## 2016-07-07 NOTE — Progress Notes (Signed)
Date:  07/07/2016   Name:  David Kennedy   DOB:  Jul 17, 1966   MRN:  [id number]   Chief Complaint: [place] (patient use to have primary doctor in [place] but he moved.) and Shortness of Breath (He had pneumonia about a month ago. He does smoke.) Gastroesophageal Reflux  He complains of coughing and heartburn. He reports no abdominal pain, no chest pain or no wheezing. This is a chronic problem. The problem occurs rarely. Pertinent negatives include no fatigue. Risk factors include smoking/tobacco exposure. He has tried a PPI for the symptoms. The treatment provided moderate relief.  Shortness of Breath  This is a chronic problem. The problem occurs daily. The problem has been waxing and waning. Pertinent negatives include no abdominal pain, chest pain, fever, headaches, leg swelling or wheezing. The symptoms are aggravated by lying flat. He has tried beta agonist inhalers and steroid inhalers for the symptoms. The treatment provided moderate relief. His past medical history is significant for pneumonia (about 6 weeks ago at [facility]).  Anxiety  Presents for follow-up visit. Symptoms include excessive worry and shortness of breath. Patient reports no chest pain, dizziness, nervous/anxious behavior or palpitations. Symptoms occur rarely. The severity of symptoms is causing significant distress.    Smoking cessation - has tried everything to quit.  Chantix and Bupropion caused severe anxiety.  Nicotine products made him nauseated.  His significant other also smokes and it trying to quit.    Review of Systems  Constitutional: Negative for chills, fatigue, fever and unexpected weight change.  HENT: Negative for congestion, sinus pain, sinus pressure and trouble swallowing.   Respiratory: Positive for cough and shortness of breath. Negative for chest tightness and wheezing.   Cardiovascular: Negative for chest pain, palpitations and leg swelling.  Gastrointestinal: Positive for heartburn.  Negative for abdominal pain and blood in stool.  Musculoskeletal: Negative for arthralgias.  Neurological: Negative for dizziness, syncope and headaches.  Psychiatric/Behavioral: Negative for dysphoric mood and sleep disturbance. The patient is not nervous/anxious.     There are no active problems to display for this patient.   Prior to Admission medications   Medication Sig Start Date End Date Taking? Authorizing Provider  azelastine (ASTELIN) 0.1 % nasal spray Place into both nostrils 2 (two) times daily. Use in each nostril as directed   Yes Historical Provider, MD  Cyanocobalamin (VITAMIN B-12 CR PO) Take by mouth daily.   Yes Historical Provider, MD  Multiple Vitamin (MULTIVITAMIN) tablet Take 1 tablet by mouth daily.   Yes Historical Provider, MD  pantoprazole (PROTONIX) 40 MG tablet Take 40 mg by mouth 2 (two) times daily.   Yes Historical Provider, MD  PARoxetine (PAXIL-CR) 25 MG 24 hr tablet Take 25 mg by mouth daily. AM   Yes Historical Provider, MD    No Known Allergies  Past Surgical History:  Procedure Laterality Date  . ETHMOIDECTOMY Bilateral 08/08/2015   Procedure: TOTAL ETHMOIDECTOMY;  Surgeon: Carloyn Manner, MD;  Location: [place];  Service: ENT;  Laterality: Bilateral;  . FRONTAL SINUS EXPLORATION Left 08/08/2015   Procedure: FRONTAL SINUS EXPLORATION;  Surgeon: Carloyn Manner, MD;  Location: [place];  Service: ENT;  Laterality: Left;  . IMAGE GUIDED SINUS SURGERY N/A 08/08/2015   Procedure: IMAGE GUIDED SINUS SURGERY;  Surgeon: Carloyn Manner, MD;  Location: [place];  Service: ENT;  Laterality: N/A;  GAVE DISK TO [facility] 12/8  . MAXILLARY ANTROSTOMY Bilateral 08/08/2015   Procedure: MAXILLARY ANTROSTOMY;  Surgeon: Carloyn Manner, MD;  Location: [place];  Service: ENT;  Laterality: Bilateral;  . MICROLARYNGOSCOPY     with biopsy  . NASAL TURBINATE REDUCTION Bilateral 08/08/2015   Procedure: TURBINATE  REDUCTION/SUBMUCOSAL RESECTION;  Surgeon: Carloyn Manner, MD;  Location: [place];  Service: ENT;  Laterality: Bilateral;  . SEPTOPLASTY N/A 08/08/2015   Procedure: SEPTOPLASTY;  Surgeon: Carloyn Manner, MD;  Location: [place];  Service: ENT;  Laterality: N/A;  . SPHENOIDECTOMY Left 08/08/2015   Procedure: Coralee Pesa;  Surgeon: Carloyn Manner, MD;  Location: [place];  Service: ENT;  Laterality: Left;  . WISDOM TOOTH EXTRACTION      Social History  Substance Use Topics  . Smoking status: Current Every Day Smoker    Packs/day: 1.00    Years: 30.00    Types: Cigarettes  . Smokeless tobacco: Never Used  . Alcohol use Yes     Comment: 2 beers/month     Medication list has been reviewed and updated.   Physical Exam  Constitutional: He is oriented to person, place, and time. He appears well-developed. No distress.  HENT:  Head: Normocephalic and atraumatic.  Neck: Normal range of motion. Neck supple. Carotid bruit is not present. No thyromegaly present.  Cardiovascular: Normal rate, regular rhythm and normal heart sounds.   Pulmonary/Chest: Effort normal and breath sounds normal. No accessory muscle usage. No respiratory distress. He has no decreased breath sounds. He has no wheezes. He has no rhonchi. He has no rales.  Cough sounds tight and wheezy - esp when laughing  Abdominal: Soft. Normal appearance and bowel sounds are normal. There is no tenderness.  Musculoskeletal: Normal range of motion.  Neurological: He is alert and oriented to person, place, and time.  Skin: Skin is warm and dry. No rash noted.  Psychiatric: He has a normal mood and affect. His speech is normal and behavior is normal. Thought content normal.  Nursing note and vitals reviewed.   BP 138/72   Pulse 74   Temp 98.2 F (36.8 C)   Resp 16   Ht 5\' 9"  (1.753 m)   Wt 236 lb (107 kg)   SpO2 95%   BMI 34.85 kg/m   Assessment and Plan: 1. Gastroesophageal reflux  disease, esophagitis presence not specified Not well controlled and may be contributing to SOB Hold pantoprazole - samples of Dexilant - dexlansoprazole (DEXILANT) 60 MG capsule; Take 1 capsule (60 mg total) by mouth daily.  Dispense: 15 capsule; Refill: 0  2. Tobacco use disorder Suggestions for help quitting given  3. Community acquired pneumonia, unspecified laterality Repeat CXR - DG Chest 2 View; Future  4. Generalized anxiety disorder Doing well - continue current medication. - PARoxetine (PAXIL-CR) 25 MG 24 hr tablet; Take 1 tablet (25 mg total) by mouth daily. AM  Dispense: 30 tablet; Refill: [place], MD [facility]  07/07/2016

## 2016-07-14 ENCOUNTER — Other Ambulatory Visit: Payer: Self-pay | Admitting: Internal Medicine

## 2016-07-14 ENCOUNTER — Telehealth: Payer: Self-pay

## 2016-07-14 DIAGNOSIS — K219 Gastro-esophageal reflux disease without esophagitis: Secondary | ICD-10-CM

## 2016-07-14 MED ORDER — DEXLANSOPRAZOLE 60 MG PO CPDR: 60.0000 mg | DELAYED_RELEASE_CAPSULE | Freq: Every day | ORAL | 2 refills | Status: DC

## 2016-07-14 NOTE — Telephone Encounter (Signed)
Wants RX for Dexilant it is working well

## 2016-09-10 DIAGNOSIS — H524 Presbyopia: Secondary | ICD-10-CM | POA: Diagnosis not present

## 2016-09-15 ENCOUNTER — Ambulatory Visit
Admission: EM | Admit: 2016-09-15 | Discharge: 2016-09-15 | Disposition: A | Discharge status: Home / Self Care | Payer: BLUE CROSS/BLUE SHIELD | Attending: Family Medicine | Admitting: Family Medicine

## 2016-09-15 DIAGNOSIS — J069 Acute upper respiratory infection, unspecified: Secondary | ICD-10-CM | POA: Diagnosis not present

## 2016-09-15 MED ORDER — FLUTICASONE PROPIONATE 50 MCG/ACT NA SUSP: 2.0000 | Freq: Every day | NASAL | 0 refills | Status: DC

## 2016-09-15 MED ORDER — BENZONATATE 200 MG PO CAPS: 200.0000 mg | ORAL_CAPSULE | Freq: Three times a day (TID) | ORAL | 0 refills | Status: DC

## 2016-09-15 NOTE — ED Provider Notes (Signed)
CSN: [id number]     Arrival date & time 09/15/16  [id number] History   First MD Initiated Contact with Patient 09/15/16 [phone]     Chief Complaint  Patient presents with  . Nasal Congestion    Cough   (Consider location/radiation/quality/duration/timing/severity/associated sxs/prior Treatment) HPI   51 year old male who presents with chest congestion along with a cough that is nonproductive. He states that he woke up this morning with the symptoms but felt somewhat fatigued yesterday. Smokes Half pack of cigarettes per day. Denies fever or chills.      Past Medical History:  Diagnosis Date  . Arthritis    osteo - hips  . GERD (gastroesophageal reflux disease)   . Wears dentures    partial upper and lower   Past Surgical History:  Procedure Laterality Date  . ETHMOIDECTOMY Bilateral 08/08/2015   Procedure: TOTAL ETHMOIDECTOMY;  Surgeon: Carloyn Manner, MD;  Location: [place];  Service: ENT;  Laterality: Bilateral;  . FRONTAL SINUS EXPLORATION Left 08/08/2015   Procedure: FRONTAL SINUS EXPLORATION;  Surgeon: Carloyn Manner, MD;  Location: [place];  Service: ENT;  Laterality: Left;  . IMAGE GUIDED SINUS SURGERY N/A 08/08/2015   Procedure: IMAGE GUIDED SINUS SURGERY;  Surgeon: Carloyn Manner, MD;  Location: [place];  Service: ENT;  Laterality: N/A;  GAVE DISK TO [facility] 12/8  . MAXILLARY ANTROSTOMY Bilateral 08/08/2015   Procedure: MAXILLARY ANTROSTOMY;  Surgeon: Carloyn Manner, MD;  Location: [place];  Service: ENT;  Laterality: Bilateral;  . MICROLARYNGOSCOPY     with biopsy  . NASAL TURBINATE REDUCTION Bilateral 08/08/2015   Procedure: TURBINATE REDUCTION/SUBMUCOSAL RESECTION;  Surgeon: Carloyn Manner, MD;  Location: [place];  Service: ENT;  Laterality: Bilateral;  . SEPTOPLASTY N/A 08/08/2015   Procedure: SEPTOPLASTY;  Surgeon: Carloyn Manner, MD;  Location: [place];  Service: ENT;  Laterality: N/A;  .  SPHENOIDECTOMY Left 08/08/2015   Procedure: Coralee Pesa;  Surgeon: Carloyn Manner, MD;  Location: [place];  Service: ENT;  Laterality: Left;  . UPPER GASTROINTESTINAL ENDOSCOPY  2013  . WISDOM TOOTH EXTRACTION     Family History  Problem Relation Age of Onset  . Diabetes Mother   . Arthritis Mother   . Heart disease Father   . Arthritis Father   . Hyperlipidemia Father   . Hypertension Father   . Pancreatic cancer Brother    Social History  Substance Use Topics  . Smoking status: Current Every Day Smoker    Packs/day: 1.00    Years: 30.00    Types: Cigarettes  . Smokeless tobacco: Never Used  . Alcohol use Yes     Comment: 2 beers/month    Review of Systems  Constitutional: Positive for activity change. Negative for chills, fatigue and fever.  HENT: Positive for congestion, postnasal drip and rhinorrhea.   Respiratory: Positive for cough. Negative for shortness of breath, wheezing and stridor.   All other systems reviewed and are negative.   Allergies  Patient has no known allergies.  Home Medications   Prior to Admission medications   Medication Sig Start Date End Date Taking? Authorizing Provider  dexlansoprazole (DEXILANT) 60 MG capsule Take 1 capsule (60 mg total) by mouth daily. 07/14/16  Yes Glean Hess, MD  Multiple Vitamin (MULTIVITAMIN) tablet Take 1 tablet by mouth daily.   Yes Historical Provider, MD  PARoxetine (PAXIL-CR) 25 MG 24 hr tablet Take 1 tablet (25 mg total) by mouth daily. AM 07/07/16  Yes Glean Hess, MD  benzonatate (TESSALON) 200 MG capsule Take 1 capsule (200 mg total) by mouth every 8 (eight) hours. 09/15/16   Lorin Picket, PA-C  fluticasone (FLONASE) 50 MCG/ACT nasal spray Place 2 sprays into both nostrils daily. 09/15/16   Lorin Picket, PA-C   Meds Ordered and Administered this Visit  Medications - No data to display  BP 134/85 (BP Location: Left Arm)   Pulse 65   Temp 97.9 F (36.6 C) (Oral)   Resp 18    Ht 5\' 9"  (1.753 m)   Wt 225 lb (102.1 kg)   SpO2 97%   BMI 33.23 kg/m  No data found.   Physical Exam  Constitutional: He appears well-developed and well-nourished. No distress.  HENT:  Head: Normocephalic and atraumatic.  Right Ear: External ear normal.  Left Ear: External ear normal.  Nose: Nose normal.  Mouth/Throat: Oropharynx is clear and moist. No oropharyngeal exudate.  Induration has 2 hard nodules in the subglottic region bilaterally ;the right being slightly larger proximately 1-1/2 cm in diameter palpable.  Skin: He is not diaphoretic.  Nursing note and vitals reviewed.   Urgent Care Course     Procedures (including critical care time)  Labs Review Labs Reviewed - No data to display  Imaging Review No results found.   Visual Acuity Review  Right Eye Distance:   Left Eye Distance:   Bilateral Distance:    Right Eye Near:   Left Eye Near:    Bilateral Near:         MDM   1. Upper respiratory tract infection, unspecified type    Discharge Medication List as of 09/15/2016  9:01 AM    START taking these medications   Details  benzonatate (TESSALON) 200 MG capsule Take 1 capsule (200 mg total) by mouth every 8 (eight) hours., Starting Mon 09/15/2016, Normal    fluticasone (FLONASE) 50 MCG/ACT nasal spray Place 2 sprays into both nostrils daily., Starting Mon 09/15/2016, Normal      Plan: 1. Test/x-ray results and diagnosis reviewed with patient 2. rx as per orders; risks, benefits, potential side effects reviewed with patient 3. Recommend supportive treatment with Rest and fluids. Informed him this is a virus and will have to run its course. No antibiotics are indicated. Recommend that he use Flonase for 3-4 weeks for drainage. Patient does have some hard nodules in the subglottic area and I have recommended that he be seen by an ENT for these for evaluation.  If He is not Improving he should follow-up with her primary care physician. 4. F/u prn if  symptoms worsen or don't improve     Lorin Picket, PA-C 09/15/16 [id number]

## 2016-09-15 NOTE — ED Triage Notes (Signed)
Pt states he woke up stopped up. He has nasal and chest congestion along with a cough. He works at [employer].

## 2016-10-05 ENCOUNTER — Other Ambulatory Visit: Payer: Self-pay | Admitting: Internal Medicine

## 2016-10-05 DIAGNOSIS — K219 Gastro-esophageal reflux disease without esophagitis: Secondary | ICD-10-CM

## 2016-11-13 ENCOUNTER — Encounter: Payer: Self-pay | Admitting: *Deleted

## 2016-11-13 ENCOUNTER — Ambulatory Visit
Admission: EM | Admit: 2016-11-13 | Discharge: 2016-11-13 | Disposition: A | Discharge status: Home / Self Care | Payer: BLUE CROSS/BLUE SHIELD | Attending: Family Medicine | Admitting: Family Medicine

## 2016-11-13 DIAGNOSIS — J069 Acute upper respiratory infection, unspecified: Secondary | ICD-10-CM

## 2016-11-13 DIAGNOSIS — M791 Myalgia: Secondary | ICD-10-CM

## 2016-11-13 MED ORDER — AZITHROMYCIN 250 MG PO TABS: 250.0000 mg | ORAL_TABLET | Freq: Every day | ORAL | 0 refills | Status: DC

## 2016-11-13 MED ORDER — HYDROCOD POLST-CPM POLST ER 10-8 MG/5ML PO SUER: 5.0000 mL | Freq: Two times a day (BID) | ORAL | 0 refills | Status: DC

## 2016-11-13 MED ORDER — BENZONATATE 200 MG PO CAPS: ORAL_CAPSULE | ORAL | 0 refills | Status: DC

## 2016-11-13 NOTE — ED Triage Notes (Signed)
Productive cough- clear, head and chest congestion, body aches, x 2 weeks. OTC meds not working.

## 2016-11-13 NOTE — ED Provider Notes (Signed)
CSN: [id number]     Arrival date & time 11/13/16  0808 History   First MD Initiated Contact with Patient 11/13/16 [phone]     Chief Complaint  Patient presents with  . Cough  . Generalized Body Aches   (Consider location/radiation/quality/duration/timing/severity/associated sxs/prior Treatment) HPI  51 year old male who presents with week history of a productive cough and chest congestion and body aches. He states that the over-the-counter medications do not seem to be helping. Review of his medical records he was here in November with similar symptoms. He is a smoker and also Electrical engineer. He denies any fever or chills.      Past Medical History:  Diagnosis Date  . Arthritis    osteo - hips  . GERD (gastroesophageal reflux disease)   . Wears dentures    partial upper and lower   Past Surgical History:  Procedure Laterality Date  . ETHMOIDECTOMY Bilateral 08/08/2015   Procedure: TOTAL ETHMOIDECTOMY;  Surgeon: Carloyn Manner, MD;  Location: [place];  Service: ENT;  Laterality: Bilateral;  . FRONTAL SINUS EXPLORATION Left 08/08/2015   Procedure: FRONTAL SINUS EXPLORATION;  Surgeon: Carloyn Manner, MD;  Location: [place];  Service: ENT;  Laterality: Left;  . IMAGE GUIDED SINUS SURGERY N/A 08/08/2015   Procedure: IMAGE GUIDED SINUS SURGERY;  Surgeon: Carloyn Manner, MD;  Location: [place];  Service: ENT;  Laterality: N/A;  GAVE DISK TO [facility] 12/8  . MAXILLARY ANTROSTOMY Bilateral 08/08/2015   Procedure: MAXILLARY ANTROSTOMY;  Surgeon: Carloyn Manner, MD;  Location: [place];  Service: ENT;  Laterality: Bilateral;  . MICROLARYNGOSCOPY     with biopsy  . NASAL TURBINATE REDUCTION Bilateral 08/08/2015   Procedure: TURBINATE REDUCTION/SUBMUCOSAL RESECTION;  Surgeon: Carloyn Manner, MD;  Location: [place];  Service: ENT;  Laterality: Bilateral;  . SEPTOPLASTY N/A 08/08/2015   Procedure: SEPTOPLASTY;  Surgeon: Carloyn Manner, MD;  Location: [place];  Service: ENT;  Laterality: N/A;  . SPHENOIDECTOMY Left 08/08/2015   Procedure: Coralee Pesa;  Surgeon: Carloyn Manner, MD;  Location: [place];  Service: ENT;  Laterality: Left;  . UPPER GASTROINTESTINAL ENDOSCOPY  2013  . WISDOM TOOTH EXTRACTION     Family History  Problem Relation Age of Onset  . Diabetes Mother   . Arthritis Mother   . Heart disease Father   . Arthritis Father   . Hyperlipidemia Father   . Hypertension Father   . Pancreatic cancer Brother    Social History  Substance Use Topics  . Smoking status: Current Every Day Smoker    Packs/day: 1.00    Years: 30.00    Types: Cigarettes  . Smokeless tobacco: Never Used  . Alcohol use Yes     Comment: 2 beers/month    Review of Systems  Constitutional: Positive for fatigue. Negative for activity change, chills and fever.  HENT: Positive for postnasal drip.   Respiratory: Positive for cough and shortness of breath. Negative for wheezing and stridor.   All other systems reviewed and are negative.   Allergies  Patient has no known allergies.  Home Medications   Prior to Admission medications   Medication Sig Start Date End Date Taking? Authorizing Provider  DEXILANT 60 MG capsule TAKE 1 CAPSULE (60 MG TOTAL) BY MOUTH DAILY. 10/06/16  Yes Glean Hess, MD  fluticasone (FLONASE) 50 MCG/ACT nasal spray Place 2 sprays into both nostrils daily. 09/15/16  Yes Lorin Picket, PA-C  Multiple Vitamin (MULTIVITAMIN) tablet Take 1 tablet by mouth  daily.   Yes Historical Provider, MD  PARoxetine (PAXIL-CR) 25 MG 24 hr tablet Take 1 tablet (25 mg total) by mouth daily. AM 07/07/16  Yes Glean Hess, MD  azithromycin (ZITHROMAX) 250 MG tablet Take 1 tablet (250 mg total) by mouth daily. Take first 2 tablets together, then 1 every day until finished. 11/13/16   Lorin Picket, PA-C  benzonatate (TESSALON) 200 MG capsule Take one cap TID PRN cough 11/13/16    Lorin Picket, PA-C  chlorpheniramine-HYDROcodone [facility]) 10-8 MG/5ML SUER Take 5 mLs by mouth 2 (two) times daily. 11/13/16   Lorin Picket, PA-C   Meds Ordered and Administered this Visit  Medications - No data to display  BP 128/84 (BP Location: Left Arm)   Pulse 73   Temp 97.5 F (36.4 C) (Oral)   Resp 16   Ht 5\' 9"  (1.753 m)   Wt 225 lb (102.1 kg)   SpO2 98%   BMI 33.23 kg/m  No data found.   Physical Exam  Constitutional: He is oriented to person, place, and time. He appears well-developed and well-nourished. No distress.  HENT:  Head: Normocephalic and atraumatic.  Right Ear: External ear normal.  Left Ear: External ear normal.  Nose: Nose normal.  Mouth/Throat: Oropharynx is clear and moist. No oropharyngeal exudate.  Eyes: Pupils are equal, round, and reactive to light. Right eye exhibits no discharge. Left eye exhibits no discharge.  Neck: Normal range of motion. Neck supple.  Pulmonary/Chest: Effort normal and breath sounds normal. No respiratory distress. He has no wheezes. He has no rales.  Musculoskeletal: Normal range of motion.  Lymphadenopathy:    He has no cervical adenopathy.  Neurological: He is alert and oriented to person, place, and time.  Skin: Skin is warm and dry. He is not diaphoretic.  Psychiatric: He has a normal mood and affect. His behavior is normal. Judgment and thought content normal.  Nursing note and vitals reviewed.   Urgent Care Course     Procedures (including critical care time)  Labs Review Labs Reviewed - No data to display  Imaging Review No results found.   Visual Acuity Review  Right Eye Distance:   Left Eye Distance:   Bilateral Distance:    Right Eye Near:   Left Eye Near:    Bilateral Near:         MDM   1. Upper respiratory tract infection, unspecified type    Discharge Medication List as of 11/13/2016  8:38 AM    START taking these medications   Details  azithromycin  (ZITHROMAX) 250 MG tablet Take 1 tablet (250 mg total) by mouth daily. Take first 2 tablets together, then 1 every day until finished., Starting Thu 11/13/2016, Normal    chlorpheniramine-HYDROcodone (TUSSIONEX PENNKINETIC ER) 10-8 MG/5ML SUER Take 5 mLs by mouth 2 (two) times daily., Starting Thu 11/13/2016, Print      Plan: 1. Test/x-ray results and diagnosis reviewed with patient 2. rx as per orders; risks, benefits, potential side effects reviewed with patient 3. Recommend supportive treatment with Rest and fluids. He should stop smoking and his success would be increased If he was supervised by clinician. As he has had these symptoms for 2 weeks out an over-the-counter medication I will initiate Z-Pak and advised him that this could be a viral illness and the coughing may last for 6-8 weeks. If he does he should be followed by his primary care physician 4. F/u prn if symptoms worsen  or don't improve     Lorin Picket, PA-C 11/13/16 [phone]

## 2016-12-11 ENCOUNTER — Other Ambulatory Visit: Payer: Self-pay

## 2016-12-11 DIAGNOSIS — F411 Generalized anxiety disorder: Secondary | ICD-10-CM

## 2016-12-11 DIAGNOSIS — K219 Gastro-esophageal reflux disease without esophagitis: Secondary | ICD-10-CM

## 2016-12-11 MED ORDER — DEXLANSOPRAZOLE 60 MG PO CPDR: 60.0000 mg | DELAYED_RELEASE_CAPSULE | Freq: Every day | ORAL | 5 refills | Status: DC

## 2016-12-11 MED ORDER — PAROXETINE HCL ER 25 MG PO TB24: 25.0000 mg | ORAL_TABLET | Freq: Every day | ORAL | 5 refills | Status: DC

## 2017-01-21 ENCOUNTER — Encounter: Payer: Self-pay | Admitting: Internal Medicine

## 2017-01-21 ENCOUNTER — Ambulatory Visit (INDEPENDENT_AMBULATORY_CARE_PROVIDER_SITE_OTHER): Payer: BLUE CROSS/BLUE SHIELD | Admitting: Internal Medicine

## 2017-01-21 VITALS — BP 130/78 | HR 88 | Ht 69.0 in | Wt 230.0 lb

## 2017-01-21 DIAGNOSIS — Z0001 Encounter for general adult medical examination with abnormal findings: Secondary | ICD-10-CM

## 2017-01-21 DIAGNOSIS — Z0000 Encounter for general adult medical examination without abnormal findings: Secondary | ICD-10-CM

## 2017-01-21 DIAGNOSIS — F17200 Nicotine dependence, unspecified, uncomplicated: Secondary | ICD-10-CM | POA: Diagnosis not present

## 2017-01-21 DIAGNOSIS — F411 Generalized anxiety disorder: Secondary | ICD-10-CM | POA: Diagnosis not present

## 2017-01-21 DIAGNOSIS — K219 Gastro-esophageal reflux disease without esophagitis: Secondary | ICD-10-CM

## 2017-01-21 DIAGNOSIS — Z125 Encounter for screening for malignant neoplasm of prostate: Secondary | ICD-10-CM

## 2017-01-21 DIAGNOSIS — Z23 Encounter for immunization: Secondary | ICD-10-CM

## 2017-01-21 DIAGNOSIS — L600 Ingrowing nail: Secondary | ICD-10-CM | POA: Diagnosis not present

## 2017-01-21 LAB — POCT URINALYSIS DIPSTICK
Bilirubin, UA: NEGATIVE
Blood, UA: NEGATIVE
Glucose, UA: NEGATIVE
KETONES UA: NEGATIVE
LEUKOCYTES UA: NEGATIVE
Nitrite, UA: NEGATIVE
PROTEIN UA: NEGATIVE
Spec Grav, UA: 1.01 (ref 1.010–1.025)
UROBILINOGEN UA: 0.2 U/dL
pH, UA: 5 (ref 5.0–8.0)

## 2017-01-21 MED ORDER — DEXLANSOPRAZOLE 60 MG PO CPDR: 60.0000 mg | DELAYED_RELEASE_CAPSULE | Freq: Two times a day (BID) | ORAL | 5 refills | Status: DC

## 2017-01-21 NOTE — Patient Instructions (Signed)
Steps to Quit Smoking Smoking tobacco can be harmful to your health and can affect almost every organ in your body. Smoking puts you, and those around you, at risk for developing many serious chronic diseases. Quitting smoking is difficult, but it is one of the best things that you can do for your health. It is never too late to quit. What are the benefits of quitting smoking? When you quit smoking, you lower your risk of developing serious diseases and conditions, such as:  Lung cancer or lung disease, such as COPD.  Heart disease.  Stroke.  Heart attack.  Infertility.  Osteoporosis and bone fractures.  Additionally, symptoms such as coughing, wheezing, and shortness of breath may get better when you quit. You may also find that you get sick less often because your body is stronger at fighting off colds and infections. If you are pregnant, quitting smoking can help to reduce your chances of having a baby of low birth weight. How do I get ready to quit? When you decide to quit smoking, create a plan to make sure that you are successful. Before you quit:  Pick a date to quit. Set a date within the next two weeks to give you time to prepare.  Write down the reasons why you are quitting. Keep this list in places where you will see it often, such as on your bathroom mirror or in your car or wallet.  Identify the people, places, things, and activities that make you want to smoke (triggers) and avoid them. Make sure to take these actions: ? Throw away all cigarettes at home, at work, and in your car. ? Throw away smoking accessories, such as ashtrays and lighters. ? Clean your car and make sure to empty the ashtray. ? Clean your home, including curtains and carpets.  Tell your family, friends, and coworkers that you are quitting. Support from your loved ones can make quitting easier.  Talk with your health care provider about your options for quitting smoking.  Find out what treatment  options are covered by your health insurance.  What strategies can I use to quit smoking? Talk with your healthcare provider about different strategies to quit smoking. Some strategies include:  Quitting smoking altogether instead of gradually lessening how much you smoke over a period of time. Research shows that quitting "cold turkey" is more successful than gradually quitting.  Attending in-person counseling to help you build problem-solving skills. You are more likely to have success in quitting if you attend several counseling sessions. Even short sessions of 10 minutes can be effective.  Finding resources and support systems that can help you to quit smoking and remain smoke-free after you quit. These resources are most helpful when you use them often. They can include: ? Online chats with a counselor. ? Telephone quitlines. ? Printed self-help materials. ? Support groups or group counseling. ? Text messaging programs. ? Mobile phone applications.  Taking medicines to help you quit smoking. (If you are pregnant or breastfeeding, talk with your health care provider first.) Some medicines contain nicotine and some do not. Both types of medicines help with cravings, but the medicines that include nicotine help to relieve withdrawal symptoms. Your health care provider may recommend: ? Nicotine patches, gum, or lozenges. ? Nicotine inhalers or sprays. ? Non-nicotine medicine that is taken by mouth.  Talk with your health care provider about combining strategies, such as taking medicines while you are also receiving in-person counseling. Using these two strategies together   makes you more likely to succeed in quitting than if you used either strategy on its own. If you are pregnant or breastfeeding, talk with your health care provider about finding counseling or other support strategies to quit smoking. Do not take medicine to help you quit smoking unless told to do so by your health care  provider. What things can I do to make it easier to quit? Quitting smoking might feel overwhelming at first, but there is a lot that you can do to make it easier. Take these important actions:  Reach out to your family and friends and ask that they support and encourage you during this time. Call telephone quitlines, reach out to support groups, or work with a counselor for support.  Ask people who smoke to avoid smoking around you.  Avoid places that trigger you to smoke, such as bars, parties, or smoke-break areas at work.  Spend time around people who do not smoke.  Lessen stress in your life, because stress can be a smoking trigger for some people. To lessen stress, try: ? Exercising regularly. ? Deep-breathing exercises. ? Yoga. ? Meditating. ? Performing a body scan. This involves closing your eyes, scanning your body from head to toe, and noticing which parts of your body are particularly tense. Purposefully relax the muscles in those areas.  Download or purchase mobile phone or tablet apps (applications) that can help you stick to your quit plan by providing reminders, tips, and encouragement. There are many free apps, such as QuitGuide from the CDC (Centers for Disease Control and Prevention). You can find other support for quitting smoking (smoking cessation) through smokefree.gov and other websites.  How will I feel when I quit smoking? Within the first 24 hours of quitting smoking, you may start to feel some withdrawal symptoms. These symptoms are usually most noticeable 2-3 days after quitting, but they usually do not last beyond 2-3 weeks. Changes or symptoms that you might experience include:  Mood swings.  Restlessness, anxiety, or irritation.  Difficulty concentrating.  Dizziness.  Strong cravings for sugary foods in addition to nicotine.  Mild weight gain.  Constipation.  Nausea.  Coughing or a sore throat.  Changes in how your medicines work in your  body.  A depressed mood.  Difficulty sleeping (insomnia).  After the first 2-3 weeks of quitting, you may start to notice more positive results, such as:  Improved sense of smell and taste.  Decreased coughing and sore throat.  Slower heart rate.  Lower blood pressure.  Clearer skin.  The ability to breathe more easily.  Fewer sick days.  Quitting smoking is very challenging for most people. Do not get discouraged if you are not successful the first time. Some people need to make many attempts to quit before they achieve long-term success. Do your best to stick to your quit plan, and talk with your health care provider if you have any questions or concerns. This information is not intended to replace advice given to you by your health care provider. Make sure you discuss any questions you have with your health care provider. Document Released: 07/29/2001 Document Revised: 04/01/2016 Document Reviewed: 12/19/2014 Elsevier Interactive Patient Education  2017 Elsevier Inc.  

## 2017-01-21 NOTE — Progress Notes (Signed)
Date:  01/21/2017   Name:  David [place]   DOB:  04-08-1966   MRN:  [id number]   Chief Complaint: Annual Exam David [place] is a 51 y.o. male who presents today for his Complete Annual Exam. He feels fairly well. 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 He reports he is sleeping fairly well.   Gastroesophageal Reflux  He complains of coughing, heartburn and wheezing. He reports no abdominal pain, no chest pain or no choking. This is a recurrent problem. The problem occurs frequently. The problem has been waxing and waning. Pertinent negatives include no fatigue. He has tried a PPI for the symptoms. Past procedures include an abdominal ultrasound and an EGD.  Anxiety  Presents for follow-up visit. Patient reports no chest pain, dizziness, palpitations or shortness of breath. The quality of sleep is good.    Toe pain - curving R great toenail causing pain.  Had L removed for same problem years ago. Smoking - has tried multiple times to quit with no success.  Medications have not agreed with him.  Does not have the motivation at this time to quit.  Review of Systems  Constitutional: Negative for appetite change, chills, diaphoresis, fatigue and unexpected weight change.  HENT: Negative for hearing loss, tinnitus, trouble swallowing and voice change.   Eyes: Negative for visual disturbance.  Respiratory: Positive for cough and wheezing. Negative for choking and shortness of breath.   Cardiovascular: Negative for chest pain, palpitations and leg swelling.  Gastrointestinal: Positive for abdominal distention and heartburn. Negative for abdominal pain, blood in stool, constipation and diarrhea.       And gerd  Genitourinary: Negative for difficulty urinating, dysuria, frequency, hematuria and urgency.  Musculoskeletal: Positive for arthralgias. Negative for back pain and myalgias.  Skin: Negative for color change and rash.  Neurological: Negative for dizziness, syncope and headaches.    Hematological: Negative for adenopathy.  Psychiatric/Behavioral: Negative for dysphoric mood and sleep disturbance.    Patient Active Problem List   Diagnosis Date Noted  . Gastroesophageal reflux disease 07/07/2016  . Tobacco use disorder 07/07/2016  . Generalized anxiety disorder 07/07/2016    Prior to Admission medications   Medication Sig Start Date End Date Taking? Authorizing Provider  dexlansoprazole (DEXILANT) 60 MG capsule Take 1 capsule (60 mg total) by mouth daily. 12/11/16  Yes Glean Hess, MD  fluticasone [facility]) 50 MCG/ACT nasal spray Place 2 sprays into both nostrils daily. 09/15/16  Yes Lorin Picket, PA-C  Multiple Vitamin (MULTIVITAMIN) tablet Take 1 tablet by mouth daily.   Yes [provider]  PARoxetine (PAXIL-CR) 25 MG 24 hr tablet Take 1 tablet (25 mg total) by mouth daily. AM 12/11/16  Yes Glean Hess, MD    No Known Allergies  Past Surgical History:  Procedure Laterality Date  . ETHMOIDECTOMY Bilateral 08/08/2015   Procedure: TOTAL ETHMOIDECTOMY;  Surgeon: Carloyn Manner, MD;  Location: [place];  Service: ENT;  Laterality: Bilateral;  . FRONTAL SINUS EXPLORATION Left 08/08/2015   Procedure: FRONTAL SINUS EXPLORATION;  Surgeon: Carloyn Manner, MD;  Location: [place];  Service: ENT;  Laterality: Left;  . IMAGE GUIDED SINUS SURGERY N/A 08/08/2015   Procedure: IMAGE GUIDED SINUS SURGERY;  Surgeon: Carloyn Manner, MD;  Location: [place];  Service: ENT;  Laterality: N/A;  GAVE DISK TO [facility] 12/8  . MAXILLARY ANTROSTOMY Bilateral 08/08/2015   Procedure: MAXILLARY ANTROSTOMY;  Surgeon: Carloyn Manner, MD;  Location: [place];  Service: ENT;  Laterality:  Bilateral;  . MICROLARYNGOSCOPY     with biopsy  . NASAL TURBINATE REDUCTION Bilateral 08/08/2015   Procedure: TURBINATE REDUCTION/SUBMUCOSAL RESECTION;  Surgeon: Carloyn Manner, MD;  Location: [place];  Service: ENT;   Laterality: Bilateral;  . SEPTOPLASTY N/A 08/08/2015   Procedure: SEPTOPLASTY;  Surgeon: Carloyn Manner, MD;  Location: [place];  Service: ENT;  Laterality: N/A;  . SPHENOIDECTOMY Left 08/08/2015   Procedure: Coralee Pesa;  Surgeon: Carloyn Manner, MD;  Location: [place];  Service: ENT;  Laterality: Left;  . UPPER GASTROINTESTINAL ENDOSCOPY  2013  . WISDOM TOOTH EXTRACTION      Social History  Substance Use Topics  . Smoking status: Current Every Day Smoker    Packs/day: 1.00    Years: 30.00    Types: Cigarettes  . Smokeless tobacco: Never Used  . Alcohol use Yes     Comment: 2 beers/month    Medication list has been reviewed and updated.   Physical Exam  Constitutional: He is oriented to person, place, and time. He appears well-developed and well-nourished.  HENT:  Head: Normocephalic.  Right Ear: Tympanic membrane, external ear and ear canal normal.  Left Ear: Tympanic membrane, external ear and ear canal normal.  Nose: Nose normal.  Mouth/Throat: Uvula is midline and oropharynx is clear and moist.  Eyes: Conjunctivae and EOM are normal. Pupils are equal, round, and reactive to light.  Neck: Normal range of motion. Neck supple. Carotid bruit is not present. No thyromegaly present.  Cardiovascular: Normal rate, regular rhythm, normal heart sounds and intact distal pulses.   Pulmonary/Chest: Effort normal and breath sounds normal. He has no wheezes. Right breast exhibits no mass. Left breast exhibits no mass.  Abdominal: Soft. Normal appearance and bowel sounds are normal. There is no hepatosplenomegaly. There is no tenderness.  Musculoskeletal: Normal range of motion.  Lymphadenopathy:    He has no cervical adenopathy.  Neurological: He is alert and oriented to person, place, and time. He has normal strength and normal reflexes. No cranial nerve deficit or sensory deficit.  Skin: Skin is warm, dry and intact.  Right great toenail curving inward    Psychiatric: He has a normal mood and affect. His speech is normal and behavior is normal. Judgment and thought content normal.  Nursing note and vitals reviewed.   BP 130/78   Pulse 88   Ht 5\' 9"  (1.753 m)   Wt 230 lb (104.3 kg)   SpO2 95%   BMI 33.97 kg/m   Assessment and Plan: 1. Annual physical exam Encouraged smoking reduction Needs CV risk reduction - Comprehensive metabolic panel - Lipid panel - POCT urinalysis dipstick  2. Prostate cancer screening DRE deferred - PSA  3. Gastroesophageal reflux disease, esophagitis presence not specified Double PPI - if no improvement will need GI referral - dexlansoprazole (DEXILANT) 60 MG capsule; Take 1 capsule (60 mg total) by mouth 2 (two) times daily.  Dispense: 60 capsule; Refill: 5 - CBC with Differential/Platelet  4. Generalized anxiety disorder Doing well on Paxil - TSH  5. Tobacco use disorder Does not feel he can quit at this time  6. Need for pneumococcal vaccination - Pneumococcal polysaccharide vaccine 23-valent greater than or equal to 2yo subcutaneous/IM  7. Ingrown right big toenail Podiatry referral   Meds ordered this encounter  Medications  . dexlansoprazole (DEXILANT) 60 MG capsule    Sig: Take 1 capsule (60 mg total) by mouth 2 (two) times daily.    Dispense:  60 capsule  Refill:  [place], MD [facility]  01/21/2017

## 2017-01-22 LAB — CBC WITH DIFFERENTIAL/PLATELET
BASOS: 1 %
Basophils Absolute: 0.1 10*3/uL (ref 0.0–0.2)
EOS (ABSOLUTE): 0.3 10*3/uL (ref 0.0–0.4)
EOS: 5 %
HEMATOCRIT: 46.3 % (ref 37.5–51.0)
Hemoglobin: 15.8 g/dL (ref 13.0–17.7)
IMMATURE GRANS (ABS): 0 10*3/uL (ref 0.0–0.1)
IMMATURE GRANULOCYTES: 0 %
Lymphocytes Absolute: 1.7 10*3/uL (ref 0.7–3.1)
Lymphs: 28 %
MCH: 29.4 pg (ref 26.6–33.0)
MCHC: 34.1 g/dL (ref 31.5–35.7)
MCV: 86 fL (ref 79–97)
MONOS ABS: 0.4 10*3/uL (ref 0.1–0.9)
Monocytes: 7 %
NEUTROS ABS: 3.6 10*3/uL (ref 1.4–7.0)
NEUTROS PCT: 59 %
PLATELETS: 216 10*3/uL (ref 150–379)
RBC: 5.37 x10E6/uL (ref 4.14–5.80)
RDW: 13.5 % (ref 12.3–15.4)
WBC: 6 10*3/uL (ref 3.4–10.8)

## 2017-01-22 LAB — COMPREHENSIVE METABOLIC PANEL
A/G RATIO: 2 (ref 1.2–2.2)
ALT: 23 IU/L (ref 0–44)
AST: 23 IU/L (ref 0–40)
Albumin: 4.6 g/dL (ref 3.5–5.5)
Alkaline Phosphatase: 88 IU/L (ref 39–117)
BILIRUBIN TOTAL: 1.5 mg/dL — AB (ref 0.0–1.2)
BUN / CREAT RATIO: 23 — AB (ref 9–20)
BUN: 21 mg/dL (ref 6–24)
CHLORIDE: 103 mmol/L (ref 96–106)
CO2: 27 mmol/L (ref 18–29)
Calcium: 9.6 mg/dL (ref 8.7–10.2)
Creatinine, Ser: 0.9 mg/dL (ref 0.76–1.27)
GFR calc Af Amer: 116 mL/min/{1.73_m2} (ref >59)
GFR calc non Af Amer: 100 mL/min/{1.73_m2} (ref >59)
GLOBULIN, TOTAL: 2.3 g/dL (ref 1.5–4.5)
Glucose: 97 mg/dL (ref 65–99)
POTASSIUM: 4.3 mmol/L (ref 3.5–5.2)
SODIUM: 143 mmol/L (ref 134–144)
TOTAL PROTEIN: 6.9 g/dL (ref 6.0–8.5)

## 2017-01-22 LAB — PSA: Prostate Specific Ag, Serum: 3.3 ng/mL (ref 0.0–4.0)

## 2017-01-22 LAB — LIPID PANEL
CHOL/HDL RATIO: 5.7 ratio — AB (ref 0.0–5.0)
Cholesterol, Total: 182 mg/dL (ref 100–199)
HDL: 32 mg/dL — AB (ref >39)
LDL CALC: 110 mg/dL — AB (ref 0–99)
Triglycerides: 202 mg/dL — ABNORMAL HIGH (ref 0–149)
VLDL CHOLESTEROL CAL: 40 mg/dL (ref 5–40)

## 2017-01-22 LAB — TSH: TSH: 2.52 u[IU]/mL (ref 0.450–4.500)

## 2017-01-27 ENCOUNTER — Encounter: Payer: Self-pay | Admitting: Internal Medicine

## 2017-01-27 DIAGNOSIS — E785 Hyperlipidemia, unspecified: Secondary | ICD-10-CM | POA: Insufficient documentation

## 2017-01-29 ENCOUNTER — Other Ambulatory Visit: Payer: Self-pay | Admitting: Internal Medicine

## 2017-01-29 MED ORDER — ATORVASTATIN CALCIUM 10 MG PO TABS: 10.0000 mg | ORAL_TABLET | Freq: Every day | ORAL | 3 refills | Status: DC

## 2017-01-29 NOTE — Progress Notes (Signed)
Spoke to pt about normal labs except high cholesterol. Informed pt he needs to be on medication per Dr. Army Melia. Pt agree's and wants medication sent to [facility] [place]. Sent pt to front to make OV for 4-6 months.

## 2017-02-03 ENCOUNTER — Telehealth: Payer: Self-pay

## 2017-02-03 ENCOUNTER — Other Ambulatory Visit: Payer: Self-pay | Admitting: Internal Medicine

## 2017-02-03 MED ORDER — PRAVASTATIN SODIUM 20 MG PO TABS: 20.0000 mg | ORAL_TABLET | Freq: Every day | ORAL | 3 refills | Status: DC

## 2017-02-03 NOTE — Telephone Encounter (Signed)
Pt informed of new medication sent to pharmacy. Told to call back and let us know how this medication does for him. Only sent 30 days.

## 2017-02-03 NOTE — Telephone Encounter (Signed)
Pt called stating started new cholesterol medication. Took for 2 days and had bad side affects. Wants different cholesterol medication sent in. Tried to contact pt to find out what side affects he was having but no answer.

## 2017-02-03 NOTE — Telephone Encounter (Signed)
I sent in a less potent medication - only 30 days to start in case he can't tolerate it either.

## 2017-02-03 NOTE — Telephone Encounter (Signed)
Pt called stating his side affects were headache, fatigue, and feeling completely disoriented. Pt states he feels he just now coming out of this and only took medication for 2 days.

## 2017-02-03 NOTE — Telephone Encounter (Signed)
I need to know what the side effects were.

## 2017-02-09 DIAGNOSIS — L03031 Cellulitis of right toe: Secondary | ICD-10-CM | POA: Diagnosis not present

## 2017-02-23 DIAGNOSIS — L03031 Cellulitis of right toe: Secondary | ICD-10-CM | POA: Diagnosis not present

## 2017-04-10 ENCOUNTER — Ambulatory Visit
Admission: EM | Admit: 2017-04-10 | Discharge: 2017-04-10 | Disposition: A | Discharge status: Home / Self Care | Payer: BLUE CROSS/BLUE SHIELD | Attending: Emergency Medicine | Admitting: Emergency Medicine

## 2017-04-10 ENCOUNTER — Encounter: Payer: Self-pay | Admitting: Emergency Medicine

## 2017-04-10 ENCOUNTER — Other Ambulatory Visit: Payer: Self-pay | Admitting: Internal Medicine

## 2017-04-10 DIAGNOSIS — J069 Acute upper respiratory infection, unspecified: Secondary | ICD-10-CM

## 2017-04-10 DIAGNOSIS — B9789 Other viral agents as the cause of diseases classified elsewhere: Secondary | ICD-10-CM | POA: Diagnosis not present

## 2017-04-10 MED ORDER — HYDROCOD POLST-CPM POLST ER 10-8 MG/5ML PO SUER: 5.0000 mL | Freq: Every evening | ORAL | 0 refills | Status: DC | PRN

## 2017-04-10 MED ORDER — BENZONATATE 100 MG PO CAPS: 100.0000 mg | ORAL_CAPSULE | Freq: Three times a day (TID) | ORAL | 0 refills | Status: DC | PRN

## 2017-04-10 NOTE — Discharge Instructions (Signed)
Take medication as prescribed. Rest. Drink plenty of fluids.  ° °Follow up with your primary care physician this week as needed. Return to Urgent care for new or worsening concerns.  ° °

## 2017-04-10 NOTE — ED Triage Notes (Signed)
Patient c/o cough, congestion and sinus pressure that started Tuesday.  Patient denies fevers.

## 2017-04-10 NOTE — ED Provider Notes (Signed)
[facility] ____________________________________________  Time seen: Approximately 8:29 AM  I have reviewed the triage vital signs and the nursing notes.   HISTORY  Chief Complaint Cough and Sinus Problem   HPI David Kennedy is a 51 y.o. male presenting for evaluation of 2-3 days of runny nose, nasal congestion, cough, sinus pressure. Patient reports that he feels like the cough has been waking him up at night and not sleeping as well. Denies chills or fevers. Says and resolved with over-the-counter cough congestion medications. 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 Denies sore throat. Reports continues to eat and drink well. Denies associated chest pain, shortness breath or wheezing. Reports has continue to remain active. Denies other aggravating or alleviating factors. States mild sinus pressure discomfort at this time. Reports multiple previous sinus surgeries.States has flonase at home, but not using regularly.   Denies chest pain, shortness of breath, abdominal pain, dysuria, extremity pain, extremity swelling or rash. Denies recent sickness. Denies recent antibiotic use. Denies cardiac history. Denies renal insufficiency.  Glean Hess, MD: PCP   Past Medical History:  Diagnosis Date  . Arthritis    osteo - hips  . GERD (gastroesophageal reflux disease)   . Wears dentures    partial upper and lower    Patient Active Problem List   Diagnosis Date Noted  . Dyslipidemia 01/27/2017  . Gastroesophageal reflux disease 07/07/2016  . Tobacco use disorder 07/07/2016  . Generalized anxiety disorder 07/07/2016    Past Surgical History:  Procedure Laterality Date  . ETHMOIDECTOMY Bilateral 08/08/2015   Procedure: TOTAL ETHMOIDECTOMY;  Surgeon: Carloyn Manner, MD;  Location: [place];  Service: ENT;  Laterality: Bilateral;  . FRONTAL SINUS EXPLORATION Left 08/08/2015   Procedure: FRONTAL  SINUS EXPLORATION;  Surgeon: Carloyn Manner, MD;  Location: [place];  Service: ENT;  Laterality: Left;  . IMAGE GUIDED SINUS SURGERY N/A 08/08/2015   Procedure: IMAGE GUIDED SINUS SURGERY;  Surgeon: Carloyn Manner, MD;  Location: [place];  Service: ENT;  Laterality: N/A;  GAVE DISK TO [facility] 12/8  . MAXILLARY ANTROSTOMY Bilateral 08/08/2015   Procedure: MAXILLARY ANTROSTOMY;  Surgeon: Carloyn Manner, MD;  Location: [place];  Service: ENT;  Laterality: Bilateral;  . MICROLARYNGOSCOPY     with biopsy  . NASAL TURBINATE REDUCTION Bilateral 08/08/2015   Procedure: TURBINATE REDUCTION/SUBMUCOSAL RESECTION;  Surgeon: Carloyn Manner, MD;  Location: [place];  Service: ENT;  Laterality: Bilateral;  . SEPTOPLASTY N/A 08/08/2015   Procedure: SEPTOPLASTY;  Surgeon: Carloyn Manner, MD;  Location: [place];  Service: ENT;  Laterality: N/A;  . SPHENOIDECTOMY Left 08/08/2015   Procedure: Coralee Pesa;  Surgeon: Carloyn Manner, MD;  Location: [place];  Service: ENT;  Laterality: Left;  . UPPER GASTROINTESTINAL ENDOSCOPY  2013  . WISDOM TOOTH EXTRACTION       No current facility-administered medications for this encounter.   Current Outpatient Prescriptions:  .  benzonatate (TESSALON PERLES) 100 MG capsule, Take 1 capsule (100 mg total) by mouth 3 (three) times daily as needed for cough., Disp: 15 capsule, Rfl: 0 .  chlorpheniramine-HYDROcodone (TUSSIONEX PENNKINETIC ER) 10-8 MG/5ML SUER, Take 5 mLs by mouth at bedtime as needed for cough. do not drive or operate machinery while taking as can cause drowsiness., Disp: 75 mL, Rfl: 0 .  dexlansoprazole (DEXILANT) 60 MG capsule, Take 1 capsule (60 mg total) by mouth 2 (two) times daily., Disp: 60 capsule, Rfl: 5 .  fluticasone (FLONASE) 50 MCG/ACT nasal spray, Place 2 sprays into both nostrils daily., Disp: 16 g, Rfl: 0 .  Multiple Vitamin (MULTIVITAMIN) tablet, Take 1 tablet by  mouth daily., Disp: , Rfl:  .  PARoxetine (PAXIL-CR) 25 MG 24 hr tablet, Take 1 tablet (25 mg total) by mouth daily. AM, Disp: 30 tablet, Rfl: 5 .  pravastatin (PRAVACHOL) 20 MG tablet, TAKE 1 TABLET BY MOUTH EVERY DAY, Disp: 30 tablet, Rfl: 3  Allergies Patient has no known allergies.  Family History  Problem Relation Age of Onset  . Diabetes Mother   . Arthritis Mother   . Heart disease Father 53  . Arthritis Father   . Hyperlipidemia Father   . Hypertension Father   . Pancreatic cancer Brother     Social History Social History  Substance Use Topics  . Smoking status: Current Every Day Smoker    Packs/day: 1.00    Years: 30.00    Types: Cigarettes  . Smokeless tobacco: Never Used  . Alcohol use Yes     Comment: 2 beers/month    Review of Systems Constitutional: No fever/chills Eyes: No visual changes. ENT: No sore throat. Cardiovascular: Denies chest pain. Respiratory: Denies shortness of breath. Gastrointestinal: No abdominal pain.  No nausea, no vomiting.  No diarrhea.  Musculoskeletal: Negative for back pain. Skin: Negative for rash. Neurological: Negative for focal weakness or numbness.   ____________________________________________   PHYSICAL EXAM:  VITAL SIGNS: ED Triage Vitals  Enc Vitals Group     BP 04/10/17 0825 134/78     Pulse Rate 04/10/17 0825 61     Resp 04/10/17 0825 16     Temp 04/10/17 0825 97.6 F (36.4 C)     Temp Source 04/10/17 0825 Oral     SpO2 04/10/17 0825 97 %     Weight 04/10/17 0822 220 lb (99.8 kg)     Height 04/10/17 0822 5\' 9"  (1.753 m)     Head Circumference --      Peak Flow --      Pain Score 04/10/17 0822 6     Pain Loc --      Pain Edu? --      Excl. in [place]? --     Constitutional: Alert and oriented. Well appearing and in no acute distress. Eyes: Conjunctivae are normal. PERRL.  Head: Atraumatic. No sinus tenderness to palpation. No swelling. No erythema.  Ears: no erythema, normal TMs bilaterally.    Nose:Nasal congestion with clear rhinorrhea  Mouth/Throat: Mucous membranes are moist. No pharyngeal erythema. No tonsillar swelling or exudate.  Neck: No stridor.  No cervical spine tenderness to palpation. Hematological/Lymphatic/Immunilogical: No cervical lymphadenopathy. Cardiovascular: Normal rate, regular rhythm. Grossly normal heart sounds.  Good peripheral circulation. Respiratory: Normal respiratory effort.  No retractions. No wheezes, rales or rhonchi. Good air movement.  Gastrointestinal: Soft and nontender.  Musculoskeletal: Ambulatory with steady gait. No cervical, thoracic or lumbar tenderness to palpation. Neurologic:  Normal speech and language. No gait instability. Skin:  Skin appears warm, dry. Psychiatric: Mood and affect are normal. Speech and behavior are normal.  ___________________________________________   LABS (all labs ordered are listed, but only abnormal results are displayed)  Labs Reviewed - No data to display ____________________________________________   PROCEDURES Procedures     INITIAL IMPRESSION / ASSESSMENT AND PLAN / ED COURSE  Pertinent labs & imaging results that were available during my care of the patient were reviewed by me and considered in my medical decision making (see chart for  details).  Well-appearing patient. No acute distress. Suspect viral upper respiratory infection with cough. Encouraged rest, fluids, supportive care. When necessary Tessalon Perles and Tussionex. Use home flonase. Work note given for today.Discussed indication, risks and benefits of medications with patient.  Discussed follow up with Primary care physician this week. Discussed follow up and return parameters including no resolution or any worsening concerns. Patient verbalized understanding and agreed to plan.   ____________________________________________   FINAL CLINICAL IMPRESSION(S) / ED DIAGNOSES  Final diagnoses:  Viral URI with cough      Discharge Medication List as of 04/10/2017  8:42 AM    START taking these medications   Details  benzonatate (TESSALON PERLES) 100 MG capsule Take 1 capsule (100 mg total) by mouth 3 (three) times daily as needed for cough., Starting Fri 04/10/2017, Normal    chlorpheniramine-HYDROcodone (TUSSIONEX PENNKINETIC ER) 10-8 MG/5ML SUER Take 5 mLs by mouth at bedtime as needed for cough. do not drive or operate machinery while taking as can cause drowsiness., Starting Fri 04/10/2017, Print        Note: This dictation was prepared with Dragon dictation along with smaller phrase technology. Any transcriptional errors that result from this process are unintentional.         Marylene Land, NP 04/10/17 [phone]

## 2017-05-21 ENCOUNTER — Encounter: Payer: Self-pay | Admitting: Emergency Medicine

## 2017-05-21 ENCOUNTER — Ambulatory Visit
Admission: EM | Admit: 2017-05-21 | Discharge: 2017-05-21 | Disposition: A | Discharge status: Home / Self Care | Payer: BLUE CROSS/BLUE SHIELD | Attending: Family Medicine | Admitting: Family Medicine

## 2017-05-21 DIAGNOSIS — R51 Headache: Secondary | ICD-10-CM

## 2017-05-21 DIAGNOSIS — J0191 Acute recurrent sinusitis, unspecified: Secondary | ICD-10-CM

## 2017-05-21 MED ORDER — DOXYCYCLINE HYCLATE 100 MG PO CAPS: 100.0000 mg | ORAL_CAPSULE | Freq: Two times a day (BID) | ORAL | 0 refills | Status: DC

## 2017-05-21 NOTE — Discharge Instructions (Signed)
Antibiotic as prescribed.  I hope you feel better soon.  Take care  Dr. Travone Georg  

## 2017-05-21 NOTE — ED Triage Notes (Signed)
Patient c/o sinus pressure and congestion and HAs and stuffy nose that started 3 days ago.

## 2017-05-21 NOTE — ED Provider Notes (Signed)
[facility]    CSN: [id number] Arrival date & time: 05/21/17  0802     History   Chief Complaint Chief Complaint  Patient presents with  . Sinus Problem  . Headache   HPI  51 year old male with history of recurrent sinusitis and prior sinus surgery in 2016 presents with concerns for sinusitis.  Patient reports that he's had a three-day history of severe sinus pain and pressure. Associated headache and dizziness. He reports subjective fever. No chills. He states that this feels like his prior instances of sinusitis. No known exacerbating or relieving factors. No medications tried. No reports of. Nasal discharge. No other associated symptoms. No other complaints or concerns at this time.  Past Medical History:  Diagnosis Date  . Arthritis    osteo - hips  . GERD (gastroesophageal reflux disease)   . Wears dentures    partial upper and lower   Patient Active Problem List   Diagnosis Date Noted  . Dyslipidemia 01/27/2017  . Gastroesophageal reflux disease 07/07/2016  . Tobacco use disorder 07/07/2016  . Generalized anxiety disorder 07/07/2016   Past Surgical History:  Procedure Laterality Date  . ETHMOIDECTOMY Bilateral 08/08/2015   Procedure: TOTAL ETHMOIDECTOMY;  Surgeon: Carloyn Manner, MD;  Location: [place];  Service: ENT;  Laterality: Bilateral;  . FRONTAL SINUS EXPLORATION Left 08/08/2015   Procedure: FRONTAL SINUS EXPLORATION;  Surgeon: Carloyn Manner, MD;  Location: [place];  Service: ENT;  Laterality: Left;  . IMAGE GUIDED SINUS SURGERY N/A 08/08/2015   Procedure: IMAGE GUIDED SINUS SURGERY;  Surgeon: Carloyn Manner, MD;  Location: [place];  Service: ENT;  Laterality: N/A;  GAVE DISK TO [facility] 12/8  . MAXILLARY ANTROSTOMY Bilateral 08/08/2015   Procedure: MAXILLARY ANTROSTOMY;  Surgeon: Carloyn Manner, MD;  Location: [place];  Service: ENT;  Laterality: Bilateral;  . MICROLARYNGOSCOPY     with  biopsy  . NASAL TURBINATE REDUCTION Bilateral 08/08/2015   Procedure: TURBINATE REDUCTION/SUBMUCOSAL RESECTION;  Surgeon: Carloyn Manner, MD;  Location: [place];  Service: ENT;  Laterality: Bilateral;  . SEPTOPLASTY N/A 08/08/2015   Procedure: SEPTOPLASTY;  Surgeon: Carloyn Manner, MD;  Location: [place];  Service: ENT;  Laterality: N/A;  . SPHENOIDECTOMY Left 08/08/2015   Procedure: Coralee Pesa;  Surgeon: Carloyn Manner, MD;  Location: [place];  Service: ENT;  Laterality: Left;  . UPPER GASTROINTESTINAL ENDOSCOPY  2013  . WISDOM TOOTH EXTRACTION      Home Medications    Prior to Admission medications   Medication Sig Start Date End Date Taking? Authorizing Provider  dexlansoprazole (DEXILANT) 60 MG capsule Take 1 capsule (60 mg total) by mouth 2 (two) times daily. 01/21/17   Glean Hess, MD  doxycycline (VIBRAMYCIN) 100 MG capsule Take 1 capsule (100 mg total) by mouth 2 (two) times daily. 05/21/17   Coral Spikes, DO  Multiple Vitamin (MULTIVITAMIN) tablet Take 1 tablet by mouth daily.    [provider]  PARoxetine (PAXIL-CR) 25 MG 24 hr tablet Take 1 tablet (25 mg total) by mouth daily. AM 12/11/16   Glean Hess, MD    Family History Family History  Problem Relation Age of Onset  . Diabetes Mother   . Arthritis Mother   . Heart disease Father 71  . Arthritis Father   . Hyperlipidemia Father   . Hypertension Father   . Pancreatic cancer Brother     Social History Social History  Substance Use Topics  . Smoking status: Current  Every Day Smoker    Packs/day: 1.00    Years: 30.00    Types: Cigarettes  . Smokeless tobacco: Never Used  . Alcohol use Yes     Comment: 2 beers/month     Allergies   Patient has no known allergies.   Review of Systems Review of Systems  Constitutional: Positive for fever. Negative for chills.  HENT: Positive for sinus pain and sinus pressure.    Physical Exam Triage Vital  Signs ED Triage Vitals  Enc Vitals Group     BP 05/21/17 0813 127/78     Pulse Rate 05/21/17 0813 73     Resp 05/21/17 0813 16     Temp 05/21/17 0813 97.8 F (36.6 C)     Temp Source 05/21/17 0813 Oral     SpO2 05/21/17 0813 97 %     Weight 05/21/17 0810 220 lb (99.8 kg)     Height 05/21/17 0810 5\' 10"  (1.778 m)     Head Circumference --      Peak Flow --      Pain Score 05/21/17 0810 6     Pain Loc --      Pain Edu? --      Excl. in [place]? --    Updated Vital Signs BP 127/78 (BP Location: Left Arm)   Pulse 73   Temp 97.8 F (36.6 C) (Oral)   Resp 16   Ht 5\' 10"  (1.778 m)   Wt 220 lb (99.8 kg)   SpO2 97%   BMI 31.57 kg/m   Physical Exam  Constitutional: He is oriented to person, place, and time. He appears well-developed. No distress.  HENT:  Head: Normocephalic and atraumatic.  Mouth/Throat: Oropharynx is clear and moist.  Normal TM's bilaterally.   Eyes: Conjunctivae are normal. No scleral icterus.  Neck: Neck supple.  Cardiovascular: Normal rate and regular rhythm.   No murmur heard. Pulmonary/Chest: Effort normal and breath sounds normal. He has no wheezes. He has no rales.  Lymphadenopathy:    He has no cervical adenopathy.  Neurological: He is alert and oriented to person, place, and time.  Skin: Skin is warm. No rash noted.  Vitals reviewed.  UC Treatments / Results  Labs (all labs ordered are listed, but only abnormal results are displayed) Labs Reviewed - No data to display  EKG  EKG Interpretation None       Radiology No results found.  Procedures Procedures (including critical care time)  Medications Ordered in UC Medications - No data to display   Initial Impression / Assessment and Plan / UC Course  I have reviewed the triage vital signs and the nursing notes.  Pertinent labs & imaging results that were available during my care of the patient were reviewed by me and considered in my medical decision making (see chart for  details).    51 year old male with a history of recurrent sinusitis and sinus surgery presents with symptoms concerning for sinusitis. Given his prior history and current symptomatology, I'm treating him empirically with doxycycline.  Final Clinical Impressions(s) / UC Diagnoses   Final diagnoses:  Acute recurrent sinusitis, unspecified location    New Prescriptions Discharge Medication List as of 05/21/2017  8:22 AM    START taking these medications   Details  doxycycline (VIBRAMYCIN) 100 MG capsule Take 1 capsule (100 mg total) by mouth 2 (two) times daily., Starting Thu 05/21/2017, Normal       Controlled Substance Prescriptions [place] Controlled Substance Registry consulted? Not Applicable  Thersa Salt [place], [place] 05/21/17 [phone]

## 2017-06-02 ENCOUNTER — Encounter: Payer: Self-pay | Admitting: Internal Medicine

## 2017-06-02 ENCOUNTER — Ambulatory Visit (INDEPENDENT_AMBULATORY_CARE_PROVIDER_SITE_OTHER): Payer: BLUE CROSS/BLUE SHIELD | Admitting: Internal Medicine

## 2017-06-02 VITALS — BP 130/84 | HR 69 | Ht 69.0 in | Wt 230.0 lb

## 2017-06-02 DIAGNOSIS — Z23 Encounter for immunization: Secondary | ICD-10-CM | POA: Diagnosis not present

## 2017-06-02 DIAGNOSIS — L309 Dermatitis, unspecified: Secondary | ICD-10-CM

## 2017-06-02 DIAGNOSIS — E785 Hyperlipidemia, unspecified: Secondary | ICD-10-CM | POA: Diagnosis not present

## 2017-06-02 MED ORDER — TRIAMCINOLONE ACETONIDE 0.1 % EX CREA: 1.0000 "application " | TOPICAL_CREAM | Freq: Two times a day (BID) | CUTANEOUS | 1 refills | Status: DC

## 2017-06-02 MED ORDER — EZETIMIBE 10 MG PO TABS: 10.0000 mg | ORAL_TABLET | Freq: Every day | ORAL | 12 refills | Status: DC

## 2017-06-02 NOTE — Progress Notes (Signed)
Date:  06/02/2017   Name:  David Kennedy   DOB:  May 29, 1966   MRN:  [id number]   Chief Complaint: Hyperlipidemia (Stopped medication two weeks ago. Stated it caused a headache everyday after taking it. ) and Immunizations (Flu Shot) Pt was prescribed lipitor but had side effects of headache, fatigue and disorientation.  He was changed to pravastatin in June but only had 30 days Rx.  He did not call back for refills or to report side effects.   Hyperlipidemia  This is a chronic problem. The problem is uncontrolled. Pertinent negatives include no chest pain or shortness of breath. He is currently on no antihyperlipidemic treatment (did not tolerate lipitor or pravachol).  Rash  This is a recurrent problem. The current episode started more than 1 year ago. The problem has been waxing and waning since onset. The affected locations include the left elbow, right hand and left hand. Associated symptoms include congestion. Pertinent negatives include no cough, fatigue or shortness of breath. Past treatments include nothing.      Review of Systems  Constitutional: Negative for appetite change, fatigue and unexpected weight change.  HENT: Positive for congestion and sinus pressure.   Eyes: Negative for visual disturbance.  Respiratory: Negative for cough, shortness of breath and wheezing.   Cardiovascular: Negative for chest pain, palpitations and leg swelling.  Gastrointestinal: Negative for abdominal pain and blood in stool.  Genitourinary: Negative for dysuria and hematuria.  Skin: Positive for rash. Negative for color change.  Neurological: Negative for numbness and headaches.  Psychiatric/Behavioral: Negative for dysphoric mood.    Patient Active Problem List   Diagnosis Date Noted  . Dyslipidemia 01/27/2017  . Gastroesophageal reflux disease 07/07/2016  . Tobacco use disorder 07/07/2016  . Generalized anxiety disorder 07/07/2016    Prior to Admission medications   Medication  Sig Start Date End Date Taking? Authorizing Provider  dexlansoprazole (DEXILANT) 60 MG capsule Take 1 capsule (60 mg total) by mouth 2 (two) times daily. 01/21/17  Yes Glean Hess, MD  doxycycline (VIBRAMYCIN) 100 MG capsule Take 1 capsule (100 mg total) by mouth 2 (two) times daily. 05/21/17  Yes Thersa Salt G, DO  Multiple Vitamin (MULTIVITAMIN) tablet Take 1 tablet by mouth daily.   Yes [provider]  PARoxetine (PAXIL-CR) 25 MG 24 hr tablet Take 1 tablet (25 mg total) by mouth daily. AM 12/11/16  Yes Glean Hess, MD    Allergies  Allergen Reactions  . Atorvastatin Other (See Comments)    Headache and fatigue    Past Surgical History:  Procedure Laterality Date  . ETHMOIDECTOMY Bilateral 08/08/2015   Procedure: TOTAL ETHMOIDECTOMY;  Surgeon: Carloyn Manner, MD;  Location: [place];  Service: ENT;  Laterality: Bilateral;  . FRONTAL SINUS EXPLORATION Left 08/08/2015   Procedure: FRONTAL SINUS EXPLORATION;  Surgeon: Carloyn Manner, MD;  Location: [place];  Service: ENT;  Laterality: Left;  . IMAGE GUIDED SINUS SURGERY N/A 08/08/2015   Procedure: IMAGE GUIDED SINUS SURGERY;  Surgeon: Carloyn Manner, MD;  Location: [place];  Service: ENT;  Laterality: N/A;  GAVE DISK TO [facility] 12/8  . MAXILLARY ANTROSTOMY Bilateral 08/08/2015   Procedure: MAXILLARY ANTROSTOMY;  Surgeon: Carloyn Manner, MD;  Location: [place];  Service: ENT;  Laterality: Bilateral;  . MICROLARYNGOSCOPY     with biopsy  . NASAL TURBINATE REDUCTION Bilateral 08/08/2015   Procedure: TURBINATE REDUCTION/SUBMUCOSAL RESECTION;  Surgeon: Carloyn Manner, MD;  Location: [place];  Service: ENT;  Laterality: Bilateral;  . SEPTOPLASTY N/A 08/08/2015   Procedure: SEPTOPLASTY;  Surgeon: Carloyn Manner, MD;  Location: [place];  Service: ENT;  Laterality: N/A;  . SPHENOIDECTOMY Left 08/08/2015   Procedure: Coralee Pesa;  Surgeon:  Carloyn Manner, MD;  Location: [place];  Service: ENT;  Laterality: Left;  . UPPER GASTROINTESTINAL ENDOSCOPY  2013  . WISDOM TOOTH EXTRACTION      Social History  Substance Use Topics  . Smoking status: Current Every Day Smoker    Packs/day: 1.00    Years: 30.00    Types: Cigarettes  . Smokeless tobacco: Never Used  . Alcohol use Yes     Comment: 2 beers/month     Medication list has been reviewed and updated.  PHQ 2/9 Scores 06/02/2017  PHQ - 2 Score 0    Physical Exam  Constitutional: He is oriented to person, place, and time. He appears well-developed. No distress.  HENT:  Head: Normocephalic and atraumatic.  Neck: Carotid bruit is not present.  Cardiovascular: Normal rate, regular rhythm and normal heart sounds.   Pulmonary/Chest: Effort normal and breath sounds normal. No respiratory distress.  Musculoskeletal: Normal range of motion.  Neurological: He is alert and oriented to person, place, and time.  Skin: Skin is warm and dry. No rash noted.  Raised warty papules on both elbows White dry patches on fingers Splitting of skin around nails  Psychiatric: He has a normal mood and affect. His behavior is normal. Thought content normal.  Nursing note and vitals reviewed.   BP (!) 140/94   Pulse 69   Ht 5\' 9"  (1.753 m)   Wt 230 lb (104.3 kg)   SpO2 96%   BMI 33.97 kg/m   Assessment and Plan: 1. Dyslipidemia Will try zetia Recommend regular aerobic exercise to increase HDL - ezetimibe (ZETIA) 10 MG tablet; Take 1 tablet (10 mg total) by mouth daily.  Dispense: 30 tablet; Refill: 12  2. Need for influenza vaccination - Flu Vaccine QUAD 36+ mos IM  3. Dermatitis Non specific  On exam - may need Dermatology evaluation if no improvement - triamcinolone cream (KENALOG) 0.1 %; Apply 1 application topically 2 (two) times daily.  Dispense: 80 g; Refill: 1   Meds ordered this encounter  Medications  . ezetimibe (ZETIA) 10 MG tablet    Sig: Take 1  tablet (10 mg total) by mouth daily.    Dispense:  30 tablet    Refill:  12  . triamcinolone cream (KENALOG) 0.1 %    Sig: Apply 1 application topically 2 (two) times daily.    Dispense:  80 g    Refill:  1    Partially dictated using Editor, commissioning. Any errors are unintentional.  Halina Maidens, MD [facility]  06/02/2017

## 2017-06-09 ENCOUNTER — Other Ambulatory Visit: Payer: Self-pay | Admitting: Internal Medicine

## 2017-06-09 DIAGNOSIS — F411 Generalized anxiety disorder: Secondary | ICD-10-CM

## 2017-06-09 MED ORDER — PAROXETINE HCL ER 25 MG PO TB24: 25.0000 mg | ORAL_TABLET | Freq: Every day | ORAL | 3 refills | Status: DC

## 2017-06-12 DIAGNOSIS — J301 Allergic rhinitis due to pollen: Secondary | ICD-10-CM | POA: Diagnosis not present

## 2017-06-12 DIAGNOSIS — J324 Chronic pansinusitis: Secondary | ICD-10-CM | POA: Diagnosis not present

## 2017-07-21 ENCOUNTER — Other Ambulatory Visit: Payer: Self-pay

## 2017-07-21 DIAGNOSIS — K219 Gastro-esophageal reflux disease without esophagitis: Secondary | ICD-10-CM

## 2017-07-21 MED ORDER — DEXLANSOPRAZOLE 60 MG PO CPDR: 60.0000 mg | DELAYED_RELEASE_CAPSULE | Freq: Two times a day (BID) | ORAL | 1 refills | Status: DC

## 2017-08-17 DIAGNOSIS — M9902 Segmental and somatic dysfunction of thoracic region: Secondary | ICD-10-CM | POA: Diagnosis not present

## 2017-08-17 DIAGNOSIS — M531 Cervicobrachial syndrome: Secondary | ICD-10-CM | POA: Diagnosis not present

## 2017-08-17 DIAGNOSIS — M9901 Segmental and somatic dysfunction of cervical region: Secondary | ICD-10-CM | POA: Diagnosis not present

## 2017-08-19 ENCOUNTER — Ambulatory Visit: Payer: BLUE CROSS/BLUE SHIELD | Admitting: Internal Medicine

## 2017-08-19 ENCOUNTER — Encounter: Payer: Self-pay | Admitting: Internal Medicine

## 2017-08-19 VITALS — BP 124/78 | HR 84 | Ht 70.0 in | Wt 230.0 lb

## 2017-08-19 DIAGNOSIS — D485 Neoplasm of uncertain behavior of skin: Secondary | ICD-10-CM

## 2017-08-19 DIAGNOSIS — N529 Male erectile dysfunction, unspecified: Secondary | ICD-10-CM | POA: Diagnosis not present

## 2017-08-19 NOTE — Progress Notes (Signed)
Date:  08/19/2017   Name:  David Kennedy   DOB:  04-23-1966   MRN:  [id number]   Chief Complaint: Nevus (Patient has moles on neck he would like checked out. Also, has mole on eyelid that popped up 2 months ago. it changes in size and shape. )  Skin tags - has several on neck but a new one on left upper eyelid.  Seems to be enlarging and sometimes falls into his field of vision.  Concern over possible low testosterone.  He is having progressive issues with decreased libido, ED, depression, fatigue and decreased energy.  He has never been checked for low T.  Review of Systems  Constitutional: Positive for fatigue. Negative for chills and unexpected weight change.  Respiratory: Negative for chest tightness, shortness of breath and wheezing.   Cardiovascular: Negative for chest pain, palpitations and leg swelling.  Gastrointestinal: Negative for abdominal distention, constipation and diarrhea.  Genitourinary: Negative for difficulty urinating.  Skin: Negative for rash.       Skin tags and new one on left upper eye lid   Neurological: Negative for dizziness, weakness, light-headedness and headaches.  Hematological: Negative for adenopathy.  Psychiatric/Behavioral: Positive for dysphoric mood. Negative for sleep disturbance and suicidal ideas.    Patient Active Problem List   Diagnosis Date Noted  . Dermatitis 06/02/2017  . Dyslipidemia 01/27/2017  . Gastroesophageal reflux disease 07/07/2016  . Tobacco use disorder 07/07/2016  . Generalized anxiety disorder 07/07/2016    Prior to Admission medications   Medication Sig Start Date End Date Taking? Authorizing Provider  budesonide (PULMICORT) 0.5 MG/2ML nebulizer solution Take 0.5 mg by nebulization 2 (two) times daily.   Yes [provider]  dexlansoprazole (DEXILANT) 60 MG capsule Take 1 capsule (60 mg total) by mouth 2 (two) times daily. 07/21/17  Yes Glean Hess, MD    06/02/17  Yes Glean Hess, MD  Multiple  Vitamin (MULTIVITAMIN) tablet Take 1 tablet by mouth daily.   Yes [provider]  PARoxetine (PAXIL-CR) 25 MG 24 hr tablet Take 1 tablet (25 mg total) by mouth daily. AM 06/09/17  Yes Glean Hess, MD  sodium chloride 0.9 % SOLN 4 mL with gentamicin (PF) 10 MG/ML SOLN 10 mg 10 mg by Intrathecal route daily.   Yes [provider]  triamcinolone cream (KENALOG) 0.1 % Apply 1 application topically 2 (two) times daily. 06/02/17  Yes Glean Hess, MD    Allergies  Allergen Reactions  . Atorvastatin Other (See Comments)    Headache and fatigue    Past Surgical History:  Procedure Laterality Date  . ETHMOIDECTOMY Bilateral 08/08/2015   Procedure: TOTAL ETHMOIDECTOMY;  Surgeon: Carloyn Manner, MD;  Location: [place];  Service: ENT;  Laterality: Bilateral;  . FRONTAL SINUS EXPLORATION Left 08/08/2015   Procedure: FRONTAL SINUS EXPLORATION;  Surgeon: Carloyn Manner, MD;  Location: [place];  Service: ENT;  Laterality: Left;  . IMAGE GUIDED SINUS SURGERY N/A 08/08/2015   Procedure: IMAGE GUIDED SINUS SURGERY;  Surgeon: Carloyn Manner, MD;  Location: [place];  Service: ENT;  Laterality: N/A;  GAVE DISK TO [facility] 12/8  . MAXILLARY ANTROSTOMY Bilateral 08/08/2015   Procedure: MAXILLARY ANTROSTOMY;  Surgeon: Carloyn Manner, MD;  Location: [place];  Service: ENT;  Laterality: Bilateral;  . MICROLARYNGOSCOPY     with biopsy  . NASAL TURBINATE REDUCTION Bilateral 08/08/2015   Procedure: TURBINATE REDUCTION/SUBMUCOSAL RESECTION;  Surgeon: Carloyn Manner, MD;  Location: [place];  Service: ENT;  Laterality: Bilateral;  . SEPTOPLASTY N/A 08/08/2015   Procedure: SEPTOPLASTY;  Surgeon: Carloyn Manner, MD;  Location: [place];  Service: ENT;  Laterality: N/A;  . SPHENOIDECTOMY Left 08/08/2015   Procedure: Coralee Pesa;  Surgeon: Carloyn Manner, MD;  Location: [place];  Service: ENT;   Laterality: Left;  . UPPER GASTROINTESTINAL ENDOSCOPY  2013  . WISDOM TOOTH EXTRACTION      Social History   Tobacco Use  . Smoking status: Current Every Day Smoker    Packs/day: 1.00    Years: 30.00    Pack years: 30.00    Types: Cigarettes  . Smokeless tobacco: Never Used  Substance Use Topics  . Alcohol use: Yes    Comment: 2 beers/month  . Drug use: No     Medication list has been reviewed and updated.  PHQ 2/9 Scores 06/02/2017  PHQ - 2 Score 0    Physical Exam  Constitutional: He is oriented to person, place, and time. He appears well-developed. No distress.  HENT:  Head: Normocephalic and atraumatic.  Cardiovascular: Normal rate, regular rhythm and normal heart sounds.  Pulmonary/Chest: Effort normal and breath sounds normal. No respiratory distress.  Musculoskeletal: Normal range of motion.  Neurological: He is alert and oriented to person, place, and time.  Skin: Skin is warm and dry. No rash noted.  Elongated tag on mid left upper eyelid. Several benign tags around neck.  Psychiatric: He has a normal mood and affect. His behavior is normal. Thought content normal.  Nursing note and vitals reviewed.   BP 124/78   Pulse 84   Ht 5\' 10"  (1.778 m)   Wt 230 lb (104.3 kg)   SpO2 97%   BMI 33.00 kg/m   Assessment and Plan: 1. 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 Erectile dysfunction, unspecified erectile dysfunction type Return for AM testosterone level - Testosterone; Future   No orders of the defined types were placed in this encounter.   Partially dictated using Editor, commissioning. Any errors are unintentional.  Halina Maidens, MD [facility]  08/19/2017

## 2017-08-19 NOTE — Patient Instructions (Signed)
Return next week before 10 AM for blood work

## 2017-08-21 DIAGNOSIS — M9901 Segmental and somatic dysfunction of cervical region: Secondary | ICD-10-CM | POA: Diagnosis not present

## 2017-08-21 DIAGNOSIS — M531 Cervicobrachial syndrome: Secondary | ICD-10-CM | POA: Diagnosis not present

## 2017-08-21 DIAGNOSIS — H029 Unspecified disorder of eyelid: Secondary | ICD-10-CM | POA: Diagnosis not present

## 2017-08-21 DIAGNOSIS — M9902 Segmental and somatic dysfunction of thoracic region: Secondary | ICD-10-CM | POA: Diagnosis not present

## 2017-08-25 ENCOUNTER — Other Ambulatory Visit: Payer: Self-pay | Admitting: Internal Medicine

## 2017-08-25 ENCOUNTER — Other Ambulatory Visit: Payer: BLUE CROSS/BLUE SHIELD

## 2017-08-25 DIAGNOSIS — N529 Male erectile dysfunction, unspecified: Secondary | ICD-10-CM | POA: Diagnosis not present

## 2017-08-25 NOTE — Progress Notes (Signed)
Pt came to pick up LAB orders only.

## 2017-08-26 DIAGNOSIS — M9901 Segmental and somatic dysfunction of cervical region: Secondary | ICD-10-CM | POA: Diagnosis not present

## 2017-08-26 DIAGNOSIS — M9902 Segmental and somatic dysfunction of thoracic region: Secondary | ICD-10-CM | POA: Diagnosis not present

## 2017-08-26 DIAGNOSIS — M531 Cervicobrachial syndrome: Secondary | ICD-10-CM | POA: Diagnosis not present

## 2017-08-26 LAB — TESTOSTERONE: Testosterone: 401 ng/dL (ref 264–916)

## 2017-09-04 DIAGNOSIS — M9902 Segmental and somatic dysfunction of thoracic region: Secondary | ICD-10-CM | POA: Diagnosis not present

## 2017-09-04 DIAGNOSIS — M531 Cervicobrachial syndrome: Secondary | ICD-10-CM | POA: Diagnosis not present

## 2017-09-04 DIAGNOSIS — M9901 Segmental and somatic dysfunction of cervical region: Secondary | ICD-10-CM | POA: Diagnosis not present

## 2017-09-09 DIAGNOSIS — M531 Cervicobrachial syndrome: Secondary | ICD-10-CM | POA: Diagnosis not present

## 2017-09-09 DIAGNOSIS — M50823 Other cervical disc disorders at C6-C7 level: Secondary | ICD-10-CM | POA: Diagnosis not present

## 2017-09-09 DIAGNOSIS — M9902 Segmental and somatic dysfunction of thoracic region: Secondary | ICD-10-CM | POA: Diagnosis not present

## 2017-09-09 DIAGNOSIS — M9901 Segmental and somatic dysfunction of cervical region: Secondary | ICD-10-CM | POA: Diagnosis not present

## 2017-09-15 DIAGNOSIS — M9902 Segmental and somatic dysfunction of thoracic region: Secondary | ICD-10-CM | POA: Diagnosis not present

## 2017-09-15 DIAGNOSIS — M531 Cervicobrachial syndrome: Secondary | ICD-10-CM | POA: Diagnosis not present

## 2017-09-15 DIAGNOSIS — M50823 Other cervical disc disorders at C6-C7 level: Secondary | ICD-10-CM | POA: Diagnosis not present

## 2017-09-15 DIAGNOSIS — M9901 Segmental and somatic dysfunction of cervical region: Secondary | ICD-10-CM | POA: Diagnosis not present

## 2017-09-22 ENCOUNTER — Encounter: Payer: Self-pay | Admitting: *Deleted

## 2017-09-22 ENCOUNTER — Other Ambulatory Visit: Payer: Self-pay

## 2017-09-22 ENCOUNTER — Ambulatory Visit
Admission: EM | Admit: 2017-09-22 | Discharge: 2017-09-22 | Disposition: A | Discharge status: Home / Self Care | Payer: BLUE CROSS/BLUE SHIELD | Attending: Family Medicine | Admitting: Family Medicine

## 2017-09-22 DIAGNOSIS — R69 Illness, unspecified: Secondary | ICD-10-CM

## 2017-09-22 DIAGNOSIS — M791 Myalgia, unspecified site: Secondary | ICD-10-CM

## 2017-09-22 DIAGNOSIS — R509 Fever, unspecified: Secondary | ICD-10-CM | POA: Diagnosis not present

## 2017-09-22 DIAGNOSIS — J111 Influenza due to unidentified influenza virus with other respiratory manifestations: Secondary | ICD-10-CM

## 2017-09-22 DIAGNOSIS — R05 Cough: Secondary | ICD-10-CM

## 2017-09-22 MED ORDER — HYDROCOD POLST-CPM POLST ER 10-8 MG/5ML PO SUER: 5.0000 mL | Freq: Two times a day (BID) | ORAL | 0 refills | Status: DC | PRN

## 2017-09-22 MED ORDER — OSELTAMIVIR PHOSPHATE 75 MG PO CAPS: 75.0000 mg | ORAL_CAPSULE | Freq: Two times a day (BID) | ORAL | 0 refills | Status: DC

## 2017-09-22 NOTE — ED Triage Notes (Signed)
PAtient started having symptoms of chills, body aches, and cough 2 days ago.

## 2017-09-22 NOTE — ED Provider Notes (Signed)
[facility]  CSN: [id number] Arrival date & time: 09/22/17  0818  History   Chief Complaint Chief Complaint  Patient presents with  . Cough  . Generalized Body Aches  . Fever   HPI  52 year old male presents with flulike symptoms.  Patient states that he has been sick for the past couple days.  He states that he has had subjective fever, chills, body aches, and now cough.  He states that he is concerned that he has influenza.  He has been taking over-the-counter cold and flu medication with some improvement.  However, his symptoms continue to persist.  No known exacerbating factors.  No other associated symptoms.  No other complaints at this time.  Past Medical History:  Diagnosis Date  . Arthritis    osteo - hips  . GERD (gastroesophageal reflux disease)   . Wears dentures    partial upper and lower   Patient Active Problem List   Diagnosis Date Noted  . Dermatitis 06/02/2017  . Dyslipidemia 01/27/2017  . Gastroesophageal reflux disease 07/07/2016  . Tobacco use disorder 07/07/2016  . Generalized anxiety disorder 07/07/2016   Past Surgical History:  Procedure Laterality Date  . ETHMOIDECTOMY Bilateral 08/08/2015   Procedure: TOTAL ETHMOIDECTOMY;  Surgeon: Carloyn Manner, MD;  Location: [place];  Service: ENT;  Laterality: Bilateral;  . FRONTAL SINUS EXPLORATION Left 08/08/2015   Procedure: FRONTAL SINUS EXPLORATION;  Surgeon: Carloyn Manner, MD;  Location: [place];  Service: ENT;  Laterality: Left;  . IMAGE GUIDED SINUS SURGERY N/A 08/08/2015   Procedure: IMAGE GUIDED SINUS SURGERY;  Surgeon: Carloyn Manner, MD;  Location: [place];  Service: ENT;  Laterality: N/A;  GAVE DISK TO [facility] 12/8  . MAXILLARY ANTROSTOMY Bilateral 08/08/2015   Procedure: MAXILLARY ANTROSTOMY;  Surgeon: Carloyn Manner, MD;  Location: [place];  Service: ENT;  Laterality: Bilateral;  . MICROLARYNGOSCOPY     with biopsy  . NASAL  TURBINATE REDUCTION Bilateral 08/08/2015   Procedure: TURBINATE REDUCTION/SUBMUCOSAL RESECTION;  Surgeon: Carloyn Manner, MD;  Location: [place];  Service: ENT;  Laterality: Bilateral;  . SEPTOPLASTY N/A 08/08/2015   Procedure: SEPTOPLASTY;  Surgeon: Carloyn Manner, MD;  Location: [place];  Service: ENT;  Laterality: N/A;  . SPHENOIDECTOMY Left 08/08/2015   Procedure: Coralee Pesa;  Surgeon: Carloyn Manner, MD;  Location: [place];  Service: ENT;  Laterality: Left;  . UPPER GASTROINTESTINAL ENDOSCOPY  2013  . WISDOM TOOTH EXTRACTION      Home Medications    Prior to Admission medications   Medication Sig Start Date End Date Taking? Authorizing Provider  budesonide (PULMICORT) 0.5 MG/2ML nebulizer solution Take 0.5 mg by nebulization 2 (two) times daily.   Yes [provider]  dexlansoprazole (DEXILANT) 60 MG capsule Take 1 capsule (60 mg total) by mouth 2 (two) times daily. 07/21/17  Yes Glean Hess, MD  Multiple Vitamin (MULTIVITAMIN) tablet Take 1 tablet by mouth daily.   Yes [provider]  PARoxetine (PAXIL-CR) 25 MG 24 hr tablet Take 1 tablet (25 mg total) by mouth daily. AM 06/09/17  Yes Glean Hess, MD  chlorpheniramine-HYDROcodone [facility] PENNKINETIC ER) 10-8 MG/5ML SUER Take 5 mLs by mouth every 12 (twelve) hours as needed. 09/22/17   Coral Spikes, DO  oseltamivir (TAMIFLU) 75 MG capsule Take 1 capsule (75 mg total) by mouth every 12 (twelve) hours. 09/22/17   Coral Spikes, DO    Family History Family History  Problem Relation Age  of Onset  . Diabetes Mother   . Arthritis Mother   . Heart disease Father 20  . Arthritis Father   . Hyperlipidemia Father   . Hypertension Father   . Pancreatic cancer Brother     Social History Social History   Tobacco Use  . Smoking status: Current Every Day Smoker    Packs/day: 1.00    Years: 30.00    Pack years: 30.00    Types: Cigarettes  . Smokeless tobacco:  Never Used  Substance Use Topics  . Alcohol use: Yes    Comment: 2 beers/month  . Drug use: No     Allergies   Zetia [ezetimibe] and Atorvastatin   Review of Systems Review of Systems  Constitutional: Positive for chills and fever.  Respiratory: Positive for cough.   Musculoskeletal:       Body aches.   Physical Exam Triage Vital Signs ED Triage Vitals  Enc Vitals Group     BP 09/22/17 0829 (!) 155/87     Pulse Rate 09/22/17 0829 66     Resp --      Temp 09/22/17 0829 98 F (36.7 C)     Temp Source 09/22/17 0829 Oral     SpO2 09/22/17 0829 100 %     Weight 09/22/17 0830 230 lb (104.3 kg)     Height 09/22/17 0830 5\' 9"  (1.753 m)     Head Circumference --      Peak Flow --      Pain Score 09/22/17 0830 0     Pain Loc --      Pain Edu? --      Excl. in [place]? --    Updated Vital Signs BP (!) 155/87 (BP Location: Left Arm)   Pulse 66   Temp 98 F (36.7 C) (Oral)   Ht 5\' 9"  (1.753 m)   Wt 230 lb (104.3 kg)   SpO2 100%   BMI 33.97 kg/m    Physical Exam  Constitutional: He is oriented to person, place, and time. He appears well-developed and well-nourished. No distress.  HENT:  Head: Normocephalic and atraumatic.  Oropharynx with erythema. No exudate.  Cardiovascular: Normal rate and regular rhythm.  Pulmonary/Chest: Effort normal and breath sounds normal. He has no wheezes. He has no rales.  Neurological: He is alert and oriented to person, place, and time.  Psychiatric: He has a normal mood and affect. His behavior is normal.  Nursing note and vitals reviewed.  UC Treatments / Results  Labs (all labs ordered are listed, but only abnormal results are displayed) Labs Reviewed - No data to display  EKG  EKG Interpretation None       Radiology No results found.  Procedures Procedures (including critical care time)  Medications Ordered in UC Medications - No data to display   Initial Impression / Assessment and Plan / UC Course  I have reviewed  the triage vital signs and the nursing notes.  Pertinent labs & imaging results that were available during my care of the patient were reviewed by me and considered in my medical decision making (see chart for details).     52 year old male presents with an influenza-like illness.  Treating with Tamiflu and Tussionex.  Final Clinical Impressions(s) / UC Diagnoses   Final diagnoses:  Influenza-like illness    ED Discharge Orders        Ordered    oseltamivir (TAMIFLU) 75 MG capsule  Every 12 hours     09/22/17 0912  chlorpheniramine-HYDROcodone (TUSSIONEX PENNKINETIC ER) 10-8 MG/5ML SUER  Every 12 hours PRN     09/22/17 0912     Controlled Substance Prescriptions [place] Controlled Substance Registry consulted? Not Applicable   Coral Spikes, [place] 09/22/17 1610

## 2017-09-22 NOTE — Discharge Instructions (Signed)
Rest, fluids.  Medication as prescribed.  Take care  Dr. Lacinda Axon

## 2017-09-23 DIAGNOSIS — M9902 Segmental and somatic dysfunction of thoracic region: Secondary | ICD-10-CM | POA: Diagnosis not present

## 2017-09-23 DIAGNOSIS — M9901 Segmental and somatic dysfunction of cervical region: Secondary | ICD-10-CM | POA: Diagnosis not present

## 2017-09-23 DIAGNOSIS — M531 Cervicobrachial syndrome: Secondary | ICD-10-CM | POA: Diagnosis not present

## 2017-09-23 DIAGNOSIS — M50823 Other cervical disc disorders at C6-C7 level: Secondary | ICD-10-CM | POA: Diagnosis not present

## 2017-10-07 DIAGNOSIS — M50823 Other cervical disc disorders at C6-C7 level: Secondary | ICD-10-CM | POA: Diagnosis not present

## 2017-10-07 DIAGNOSIS — M531 Cervicobrachial syndrome: Secondary | ICD-10-CM | POA: Diagnosis not present

## 2017-10-07 DIAGNOSIS — M9902 Segmental and somatic dysfunction of thoracic region: Secondary | ICD-10-CM | POA: Diagnosis not present

## 2017-10-07 DIAGNOSIS — M9901 Segmental and somatic dysfunction of cervical region: Secondary | ICD-10-CM | POA: Diagnosis not present

## 2017-10-15 DIAGNOSIS — M542 Cervicalgia: Secondary | ICD-10-CM | POA: Diagnosis not present

## 2017-10-16 ENCOUNTER — Other Ambulatory Visit: Payer: Self-pay | Admitting: Unknown Physician Specialty

## 2017-10-16 DIAGNOSIS — M542 Cervicalgia: Secondary | ICD-10-CM

## 2017-10-23 ENCOUNTER — Ambulatory Visit
Admission: RE | Admit: 2017-10-23 | Discharge: 2017-10-23 | Disposition: A | Discharge status: Home / Self Care | Payer: BLUE CROSS/BLUE SHIELD | Source: Ambulatory Visit | Attending: Unknown Physician Specialty | Admitting: Unknown Physician Specialty

## 2017-10-23 DIAGNOSIS — M542 Cervicalgia: Secondary | ICD-10-CM

## 2017-10-23 DIAGNOSIS — M4802 Spinal stenosis, cervical region: Secondary | ICD-10-CM | POA: Insufficient documentation

## 2017-11-30 DIAGNOSIS — M5412 Radiculopathy, cervical region: Secondary | ICD-10-CM | POA: Diagnosis not present

## 2017-11-30 DIAGNOSIS — M5030 Other cervical disc degeneration, unspecified cervical region: Secondary | ICD-10-CM | POA: Diagnosis not present

## 2017-12-18 ENCOUNTER — Ambulatory Visit (INDEPENDENT_AMBULATORY_CARE_PROVIDER_SITE_OTHER): Payer: BLUE CROSS/BLUE SHIELD | Admitting: Internal Medicine

## 2017-12-18 ENCOUNTER — Other Ambulatory Visit: Payer: Self-pay | Admitting: Internal Medicine

## 2017-12-18 ENCOUNTER — Encounter: Payer: Self-pay | Admitting: Internal Medicine

## 2017-12-18 VITALS — BP 110/76 | HR 78 | Resp 16 | Ht 69.0 in | Wt 224.8 lb

## 2017-12-18 DIAGNOSIS — E785 Hyperlipidemia, unspecified: Secondary | ICD-10-CM

## 2017-12-18 DIAGNOSIS — F411 Generalized anxiety disorder: Secondary | ICD-10-CM | POA: Diagnosis not present

## 2017-12-18 DIAGNOSIS — Z0001 Encounter for general adult medical examination with abnormal findings: Secondary | ICD-10-CM | POA: Diagnosis not present

## 2017-12-18 DIAGNOSIS — Z23 Encounter for immunization: Secondary | ICD-10-CM | POA: Diagnosis not present

## 2017-12-18 DIAGNOSIS — K2100 Gastro-esophageal reflux disease with esophagitis, without bleeding: Secondary | ICD-10-CM

## 2017-12-18 DIAGNOSIS — M542 Cervicalgia: Secondary | ICD-10-CM | POA: Diagnosis not present

## 2017-12-18 DIAGNOSIS — F17200 Nicotine dependence, unspecified, uncomplicated: Secondary | ICD-10-CM

## 2017-12-18 DIAGNOSIS — Z981 Arthrodesis status: Secondary | ICD-10-CM | POA: Insufficient documentation

## 2017-12-18 DIAGNOSIS — Z0000 Encounter for general adult medical examination without abnormal findings: Secondary | ICD-10-CM

## 2017-12-18 DIAGNOSIS — Z114 Encounter for screening for human immunodeficiency virus [HIV]: Secondary | ICD-10-CM

## 2017-12-18 LAB — POCT URINALYSIS DIPSTICK
BILIRUBIN UA: NEGATIVE
Blood, UA: NEGATIVE
GLUCOSE UA: NEGATIVE
KETONES UA: NEGATIVE
Leukocytes, UA: NEGATIVE
Nitrite, UA: NEGATIVE
Protein, UA: NEGATIVE
Spec Grav, UA: 1.01 (ref 1.010–1.025)
Urobilinogen, UA: 0.2 E.U./dL
pH, UA: 6 (ref 5.0–8.0)

## 2017-12-18 MED ORDER — DEXLANSOPRAZOLE 60 MG PO CPDR: 60.0000 mg | DELAYED_RELEASE_CAPSULE | Freq: Two times a day (BID) | ORAL | 1 refills | Status: DC

## 2017-12-18 NOTE — Patient Instructions (Addendum)
Tdap Vaccine (Tetanus, Diphtheria and Pertussis): What You Need to Know 1. Why get vaccinated? Tetanus, diphtheria and pertussis are very serious diseases. Tdap vaccine can protect us from these diseases. And, Tdap vaccine given to pregnant women can protect newborn babies against pertussis. TETANUS (Lockjaw) is rare in the [place] today. It causes painful muscle tightening and stiffness, usually all over the body.  It can lead to tightening of muscles in the head and neck so you can't open your mouth, swallow, or sometimes even breathe. Tetanus kills about 1 out of 10 people who are infected even after receiving the best medical care.  DIPHTHERIA is also rare in the [place] today. It can cause a thick coating to form in the back of the throat.  It can lead to breathing problems, heart failure, paralysis, and death.  PERTUSSIS (Whooping Cough) causes severe coughing spells, which can cause difficulty breathing, vomiting and disturbed sleep.  It can also lead to weight loss, incontinence, and rib fractures. Up to 2 in 100 adolescents and 5 in 100 adults with pertussis are hospitalized or have complications, which could include pneumonia or death.  These diseases are caused by bacteria. Diphtheria and pertussis are spread from person to person through secretions from coughing or sneezing. Tetanus enters the body through cuts, scratches, or wounds. Before vaccines, as many as 200,000 cases of diphtheria, 200,000 cases of pertussis, and hundreds of cases of tetanus, were reported in the [place] each year. Since vaccination began, reports of cases for tetanus and diphtheria have dropped by about 99% and for pertussis by about 80%. 2. Tdap vaccine Tdap vaccine can protect adolescents and adults from tetanus, diphtheria, and pertussis. One dose of Tdap is routinely given at age 11 or 12. People who did not get Tdap at that age should get it as soon as possible. Tdap is especially  important for healthcare professionals and anyone having close contact with a baby younger than 12 months. Pregnant women should get a dose of Tdap during every pregnancy, to protect the newborn from pertussis. Infants are most at risk for severe, life-threatening complications from pertussis. Another vaccine, called Td, protects against tetanus and diphtheria, but not pertussis. A Td booster should be given every 10 years. Tdap may be given as one of these boosters if you have never gotten Tdap before. Tdap may also be given after a severe cut or burn to prevent tetanus infection. Your doctor or the person giving you the vaccine can give you more information. Tdap may safely be given at the same time as other vaccines. 3. Some people should not get this vaccine  A person who has ever had a life-threatening allergic reaction after a previous dose of any diphtheria, tetanus or pertussis containing vaccine, OR has a severe allergy to any part of this vaccine, should not get Tdap vaccine. Tell the person giving the vaccine about any severe allergies.  Anyone who had coma or long repeated seizures within 7 days after a childhood dose of DTP or DTaP, or a previous dose of Tdap, should not get Tdap, unless a cause other than the vaccine was found. They can still get Td.  Talk to your doctor if you: ? have seizures or another nervous system problem, ? had severe pain or swelling after any vaccine containing diphtheria, tetanus or pertussis, ? ever had a condition called Guillain-Barr Syndrome (GBS), ? aren't feeling well on the day the shot is scheduled. 4. Risks With any medicine, including   vaccines, there is a chance of side effects. These are usually mild and go away on their own. Serious reactions are also possible but are rare. Most people who get Tdap vaccine do not have any problems with it. Mild problems following Tdap: (Did not interfere with activities)  Pain where the shot was given (about  3 in 4 adolescents or 2 in 3 adults)  Redness or swelling where the shot was given (about 1 person in 5)  Mild fever of at least 100.4F (up to about 1 in 25 adolescents or 1 in 100 adults)  Headache (about 3 or 4 people in 10)  Tiredness (about 1 person in 3 or 4)  Nausea, vomiting, diarrhea, stomach ache (up to 1 in 4 adolescents or 1 in 10 adults)  Chills, sore joints (about 1 person in 10)  Body aches (about 1 person in 3 or 4)  Rash, swollen glands (uncommon)  Moderate problems following Tdap: (Interfered with activities, but did not require medical attention)  Pain where the shot was given (up to 1 in 5 or 6)  Redness or swelling where the shot was given (up to about 1 in 16 adolescents or 1 in 12 adults)  Fever over 102F (about 1 in 100 adolescents or 1 in 250 adults)  Headache (about 1 in 7 adolescents or 1 in 10 adults)  Nausea, vomiting, diarrhea, stomach ache (up to 1 or 3 people in 100)  Swelling of the entire arm where the shot was given (up to about 1 in 500).  Severe problems following Tdap: (Unable to perform usual activities; required medical attention)  Swelling, severe pain, bleeding and redness in the arm where the shot was given (rare).  Problems that could happen after any vaccine:  People sometimes faint after a medical procedure, including vaccination. Sitting or lying down for about 15 minutes can help prevent fainting, and injuries caused by a fall. Tell your doctor if you feel dizzy, or have vision changes or ringing in the ears.  Some people get severe pain in the shoulder and have difficulty moving the arm where a shot was given. This happens very rarely.  Any medication can cause a severe allergic reaction. Such reactions from a vaccine are very rare, estimated at fewer than 1 in a million doses, and would happen within a few minutes to a few hours after the vaccination. As with any medicine, there is a very remote chance of a vaccine  causing a serious injury or death. The safety of vaccines is always being monitored. For more information, visit: www.cdc.gov/vaccinesafety/ 5. What if there is a serious problem? What should I look for? Look for anything that concerns you, such as signs of a severe allergic reaction, very high fever, or unusual behavior. Signs of a severe allergic reaction can include hives, swelling of the face and throat, difficulty breathing, a fast heartbeat, dizziness, and weakness. These would usually start a few minutes to a few hours after the vaccination. What should I do?  If you think it is a severe allergic reaction or other emergency that can't wait, call 9-1-1 or get the person to the nearest hospital. Otherwise, call your doctor.  Afterward, the reaction should be reported to the Vaccine Adverse Event Reporting System (VAERS). Your doctor might file this report, or you can do it yourself through the VAERS web site at www.vaers.hhs.gov, or by calling 1-800-822-7967. ? VAERS does not give medical advice. 6. The National Vaccine Injury Compensation Program The National   Vaccine Injury Compensation Program (VICP) is a federal program that was created to compensate people who may have been injured by certain vaccines. Persons who believe they may have been injured by a vaccine can learn about the program and about filing a claim by calling 1-800-338-2382 or visiting the VICP website at www.hrsa.gov/vaccinecompensation. There is a time limit to file a claim for compensation. 7. How can I learn more?  Ask your doctor. He or she can give you the vaccine package insert or suggest other sources of information.  Call your local or state health department.  Contact the Centers for Disease Control and Prevention (CDC): ? Call 1-800-232-4636 (1-800-CDC-INFO) or ? Visit CDC's website at www.cdc.gov/vaccines CDC Tdap Vaccine VIS (10/11/13) This information is not intended to replace advice given to you by your  health care provider. Make sure you discuss any questions you have with your health care provider. Document Released: 02/03/2012 Document Revised: 04/24/2016 Document Reviewed: 04/24/2016 Elsevier Interactive Patient Education  2017 Elsevier Inc.  Health Maintenance, Male A healthy lifestyle and preventive care is important for your health and wellness. Ask your health care provider about what schedule of regular examinations is right for you. What should I know about weight and diet? Eat a Healthy Diet  Eat plenty of vegetables, fruits, whole grains, low-fat dairy products, and lean protein.  Do not eat a lot of foods high in solid fats, added sugars, or salt.  Maintain a Healthy Weight Regular exercise can help you achieve or maintain a healthy weight. You should:  Do at least 150 minutes of exercise each week. The exercise should increase your heart rate and make you sweat (moderate-intensity exercise).  Do strength-training exercises at least twice a week.  Watch Your Levels of Cholesterol and Blood Lipids  Have your blood tested for lipids and cholesterol every 5 years starting at 50 years of age. If you are at high risk for heart disease, you should start having your blood tested when you are 50 years old. You may need to have your cholesterol levels checked more often if: ? Your lipid or cholesterol levels are high. ? You are older than 50 years of age. ? You are at high risk for heart disease.  What should I know about cancer screening? Many types of cancers can be detected early and may often be prevented. Lung Cancer  You should be screened every year for lung cancer if: ? You are a current smoker who has smoked for at least 30 years. ? You are a former smoker who has quit within the past 15 years.  Talk to your health care provider about your screening options, when you should start screening, and how often you should be screened.  Colorectal Cancer  Routine  colorectal cancer screening usually begins at 50 years of age and should be repeated every 5-10 years until you are 50 years old. You may need to be screened more often if early forms of precancerous polyps or small growths are found. Your health care provider may recommend screening at an earlier age if you have risk factors for colon cancer.  Your health care provider may recommend using home test kits to check for hidden blood in the stool.  A small camera at the end of a tube can be used to examine your colon (sigmoidoscopy or colonoscopy). This checks for the earliest forms of colorectal cancer.  Prostate and Testicular Cancer  Depending on your age and overall health, your health care provider   may do certain tests to screen for prostate and testicular cancer.  Talk to your health care provider about any symptoms or concerns you have about testicular or prostate cancer.  Skin Cancer  Check your skin from head to toe regularly.  Tell your health care provider about any new moles or changes in moles, especially if: ? There is a change in a mole's size, shape, or color. ? You have a mole that is larger than a pencil eraser.  Always use sunscreen. Apply sunscreen liberally and repeat throughout the day.  Protect yourself by wearing long sleeves, pants, a wide-brimmed hat, and sunglasses when outside.  What should I know about heart disease, diabetes, and high blood pressure?  If you are 18-39 years of age, have your blood pressure checked every 3-5 years. If you are 40 years of age or older, have your blood pressure checked every year. You should have your blood pressure measured twice-once when you are at a hospital or clinic, and once when you are not at a hospital or clinic. Record the average of the two measurements. To check your blood pressure when you are not at a hospital or clinic, you can use: ? An automated blood pressure machine at a pharmacy. ? A home blood pressure  monitor.  Talk to your health care provider about your target blood pressure.  If you are between 45-79 years old, ask your health care provider if you should take aspirin to prevent heart disease.  Have regular diabetes screenings by checking your fasting blood sugar level. ? If you are at a normal weight and have a low risk for diabetes, have this test once every three years after the age of 45. ? If you are overweight and have a high risk for diabetes, consider being tested at a younger age or more often.  A one-time screening for abdominal aortic aneurysm (AAA) by ultrasound is recommended for men aged 65-75 years who are current or former smokers. What should I know about preventing infection? Hepatitis B If you have a higher risk for hepatitis B, you should be screened for this virus. Talk with your health care provider to find out if you are at risk for hepatitis B infection. Hepatitis C Blood testing is recommended for:  Everyone born from 1945 through 1965.  Anyone with known risk factors for hepatitis C.  Sexually Transmitted Diseases (STDs)  You should be screened each year for STDs including gonorrhea and chlamydia if: ? You are sexually active and are younger than 50 years of age. ? You are older than 50 years of age and your health care provider tells you that you are at risk for this type of infection. ? Your sexual activity has changed since you were last screened and you are at an increased risk for chlamydia or gonorrhea. Ask your health care provider if you are at risk.  Talk with your health care provider about whether you are at high risk of being infected with HIV. Your health care provider may recommend a prescription medicine to help prevent HIV infection.  What else can I do?  Schedule regular health, dental, and eye exams.  Stay current with your vaccines (immunizations).  Do not use any tobacco products, such as cigarettes, chewing tobacco, and  e-cigarettes. If you need help quitting, ask your health care provider.  Limit alcohol intake to no more than 2 drinks per day. One drink equals 12 ounces of beer, 5 ounces of wine, or 1   ounces of hard liquor.  Do not use street drugs.  Do not share needles.  Ask your health care provider for help if you need support or information about quitting drugs.  Tell your health care provider if you often feel depressed.  Tell your health care provider if you have ever been abused or do not feel safe at home. This information is not intended to replace advice given to you by your health care provider. Make sure you discuss any questions you have with your health care provider. Document Released: 01/31/2008 Document Revised: 04/02/2016 Document Reviewed: 05/08/2015 Elsevier Interactive Patient Education  [employer].

## 2017-12-18 NOTE — Progress Notes (Signed)
Date:  12/18/2017   Name:  David Kennedy   DOB:  12-Oct-1965   MRN:  [id number]   Chief Complaint: Annual Exam David Kennedy is a 52 y.o. male who presents today for his Complete Annual Exam. He feels fairly well. He reports exercising regularly. He reports he is sleeping fairly well. He is due for colonoscopy.  Gastroesophageal Reflux  He complains of heartburn. He reports no abdominal pain, no chest pain, no choking or no wheezing. This is a recurrent problem. The problem occurs rarely. Pertinent negatives include no fatigue. Risk factors include smoking/tobacco exposure and Barrett's esophagus. He has tried a PPI for the symptoms. The treatment provided moderate relief.  Anxiety  Presents for follow-up visit. Patient reports no chest pain, dizziness, palpitations or shortness of breath. Symptoms occur occasionally (controlled with Paxil).    Neck Pain   This is a chronic problem. The problem has been gradually worsening. Pertinent negatives include no chest pain, headaches or trouble swallowing. Treatments tried: seen by Ortho - had MRI. Improvement on treatment: referred to Dr. Sharlet Salina and had one ESI with improvement.  Hyperlipidemia  This is a chronic problem. The problem is uncontrolled. Pertinent negatives include no chest pain, myalgias or shortness of breath. He is currently on no antihyperlipidemic treatment (did not tolerate pravachol, changed to lipitor).  Tobacco use - he is still smoking 1 ppd,  Could not use chantix. He has stopped before all on his own and needs to try again.  Lab Results  Component Value Date   CREATININE 0.90 01/21/2017   BUN 21 01/21/2017   NA 143 01/21/2017   K 4.3 01/21/2017   CL 103 01/21/2017   CO2 27 01/21/2017   Lab Results  Component Value Date   CHOL 182 01/21/2017   HDL 32 (L) 01/21/2017   LDLCALC 110 (H) 01/21/2017   TRIG 202 (H) 01/21/2017   CHOLHDL 5.7 (H) 01/21/2017    Review of Systems  Constitutional: Negative for  appetite change, chills, diaphoresis, fatigue and unexpected weight change.  HENT: Negative for hearing loss, tinnitus, trouble swallowing and voice change.   Eyes: Negative for visual disturbance.  Respiratory: Negative for choking, shortness of breath and wheezing.   Cardiovascular: Negative for chest pain, palpitations and leg swelling.  Gastrointestinal: Positive for heartburn. Negative for abdominal pain, blood in stool, constipation and diarrhea.  Genitourinary: Negative for difficulty urinating, dysuria and frequency.       Occasional nocturia   Musculoskeletal: Positive for neck pain and neck stiffness. Negative for arthralgias, back pain and myalgias.  Skin: Negative for color change and rash.  Allergic/Immunologic: Negative for environmental allergies.  Neurological: Negative for dizziness, syncope and headaches.  Hematological: Negative for adenopathy.  Psychiatric/Behavioral: Negative for dysphoric mood and sleep disturbance.    Patient Active Problem List   Diagnosis Date Noted  . Cervical pain (neck) 12/18/2017  . Dermatitis 06/02/2017  . Dyslipidemia 01/27/2017  . Gastroesophageal reflux disease 07/07/2016  . Tobacco use disorder 07/07/2016  . Generalized anxiety disorder 07/07/2016    Prior to Admission medications   Medication Sig Start Date End Date Taking? Authorizing Provider  budesonide (PULMICORT) 0.5 MG/2ML nebulizer solution Take 0.5 mg by nebulization 2 (two) times daily.    [provider]  dexlansoprazole (DEXILANT) 60 MG capsule Take 1 capsule (60 mg total) by mouth 2 (two) times daily. 07/21/17   Glean Hess, MD  Multiple Vitamin (MULTIVITAMIN) tablet Take 1 tablet by mouth daily.    [provider]  PARoxetine (PAXIL-CR) 25 MG 24 hr tablet Take 1 tablet (25 mg total) by mouth daily. AM 06/09/17   Glean Hess, MD       [provider]    Allergies  Allergen Reactions  . Atorvastatin Other (See Comments)     Headache and fatigue   . Ezetimibe Other (See Comments)    Joint pain     Past Surgical History:  Procedure Laterality Date  . ETHMOIDECTOMY Bilateral 08/08/2015   Procedure: TOTAL ETHMOIDECTOMY;  Surgeon: Carloyn Manner, MD;  Location: [facility];  Service: ENT;  Laterality: Bilateral;  . FRONTAL SINUS EXPLORATION Left 08/08/2015   Procedure: FRONTAL SINUS EXPLORATION;  Surgeon: Carloyn Manner, MD;  Location: [facility];  Service: ENT;  Laterality: Left;  . IMAGE GUIDED SINUS SURGERY N/A 08/08/2015   Procedure: IMAGE GUIDED SINUS SURGERY;  Surgeon: Carloyn Manner, MD;  Location: [facility];  Service: ENT;  Laterality: N/A;  GAVE DISK TO [facility] 12/8  . MAXILLARY ANTROSTOMY Bilateral 08/08/2015   Procedure: MAXILLARY ANTROSTOMY;  Surgeon: Carloyn Manner, MD;  Location: [place];  Service: ENT;  Laterality: Bilateral;  . MICROLARYNGOSCOPY     with biopsy  . NASAL TURBINATE REDUCTION Bilateral 08/08/2015   Procedure: TURBINATE REDUCTION/SUBMUCOSAL RESECTION;  Surgeon: Carloyn Manner, MD;  Location: [facility];  Service: ENT;  Laterality: Bilateral;  . SEPTOPLASTY N/A 08/08/2015   Procedure: SEPTOPLASTY;  Surgeon: Carloyn Manner, MD;  Location: [place] [facility];  Service: ENT;  Laterality: N/A;  . SPHENOIDECTOMY Left 08/08/2015   Procedure: Coralee Pesa;  Surgeon: Carloyn Manner, MD;  Location: [facility];  Service: ENT;  Laterality: Left;  . UPPER GASTROINTESTINAL ENDOSCOPY  2013  . WISDOM TOOTH EXTRACTION      Social History   Tobacco Use  . Smoking status: Current Every Day Smoker    Packs/day: 1.00    Years: 30.00    Pack years: 30.00    Types: Cigarettes  . Smokeless tobacco: Never Used  Substance Use Topics  . Alcohol use: Yes    Comment: 2 beers/month  . Drug use: No     Medication list has been reviewed and updated.  PHQ 2/9 Scores 12/18/2017 06/02/2017  PHQ - 2 Score 0 0    Physical Exam    Constitutional: He is oriented to person, place, and time. He appears well-developed and well-nourished.  HENT:  Head: Normocephalic.  Right Ear: Tympanic membrane, external ear and ear canal normal.  Left Ear: Tympanic membrane, external ear and ear canal normal.  Nose: Nose normal.  Mouth/Throat: Uvula is midline and oropharynx is clear and moist.  Eyes: Pupils are equal, round, and reactive to light. Conjunctivae and EOM are normal.  Neck: Normal range of motion. Neck supple. Carotid bruit is not present. No thyromegaly present.  Cardiovascular: Normal rate, regular rhythm, normal heart sounds and intact distal pulses.  Pulmonary/Chest: Effort normal and breath sounds normal. He has no wheezes. Right breast exhibits no mass. Left breast exhibits no mass.  Abdominal: Soft. Normal appearance and bowel sounds are normal. There is no hepatosplenomegaly. There is no tenderness.  Musculoskeletal: Normal range of motion.  Lymphadenopathy:    He has no cervical adenopathy.  Neurological: He is alert and oriented to person, place, and time. He has normal reflexes.  Skin: Skin is warm, dry and intact.  Psychiatric: He has a normal mood and affect. His speech is normal and behavior is normal. Judgment and thought content normal.  Nursing note  and vitals reviewed.   BP 110/76   Pulse 78   Resp 16   Ht 5\' 9"  (1.753 m)   Wt 224 lb 12.8 oz (102 kg)   SpO2 96%   BMI 33.20 kg/m   Assessment and Plan: 1. Annual physical exam Continue improved diet with low fat choices, regular exercise - Comprehensive metabolic panel - POCT urinalysis dipstick - PSA  2. Gastroesophageal reflux disease with esophagitis With hx of Barrett's  - dexlansoprazole (DEXILANT) 60 MG capsule; Take 1 capsule (60 mg total) by mouth 2 (two) times daily.  Dispense: 180 capsule; Refill: 1 - CBC with Differential/Platelet - Ambulatory referral to Gastroenterology  3. Generalized anxiety disorder Doing well on  Paxil  4. Dyslipidemia Did not tolerate 2 statins - Lipid panel  5. Tobacco use disorder Options discussed  6. Cervical pain (neck) Continue follow up with Dr. Sharlet Salina for ESI  7. Encounter for screening for HIV - HIV antibody  8. Need for diphtheria-tetanus-pertussis (Tdap) vaccine - Tdap vaccine greater than or equal to 7yo IM   Meds ordered this encounter  Medications  . dexlansoprazole (DEXILANT) 60 MG capsule    Sig: Take 1 capsule (60 mg total) by mouth 2 (two) times daily.    Dispense:  180 capsule    Refill:  1    Partially dictated using Editor, commissioning. Any errors are unintentional.  Halina Maidens, MD [facility]  12/18/2017

## 2017-12-19 LAB — CBC WITH DIFFERENTIAL/PLATELET
BASOS ABS: 0.1 10*3/uL (ref 0.0–0.2)
Basos: 2 %
EOS (ABSOLUTE): 0.3 10*3/uL (ref 0.0–0.4)
Eos: 5 %
HEMATOCRIT: 45.5 % (ref 37.5–51.0)
Hemoglobin: 15.6 g/dL (ref 13.0–17.7)
IMMATURE GRANS (ABS): 0 10*3/uL (ref 0.0–0.1)
IMMATURE GRANULOCYTES: 0 %
LYMPHS: 25 %
Lymphocytes Absolute: 1.4 10*3/uL (ref 0.7–3.1)
MCH: 29.8 pg (ref 26.6–33.0)
MCHC: 34.3 g/dL (ref 31.5–35.7)
MCV: 87 fL (ref 79–97)
MONOCYTES: 8 %
Monocytes Absolute: 0.4 10*3/uL (ref 0.1–0.9)
NEUTROS PCT: 60 %
Neutrophils Absolute: 3.4 10*3/uL (ref 1.4–7.0)
Platelets: 234 10*3/uL (ref 150–379)
RBC: 5.23 x10E6/uL (ref 4.14–5.80)
RDW: 13.4 % (ref 12.3–15.4)
WBC: 5.6 10*3/uL (ref 3.4–10.8)

## 2017-12-19 LAB — COMPREHENSIVE METABOLIC PANEL
ALT: 18 IU/L (ref 0–44)
AST: 17 IU/L (ref 0–40)
Albumin/Globulin Ratio: 2.4 — ABNORMAL HIGH (ref 1.2–2.2)
Albumin: 4.6 g/dL (ref 3.5–5.5)
Alkaline Phosphatase: 83 IU/L (ref 39–117)
BUN/Creatinine Ratio: 17 (ref 9–20)
BUN: 16 mg/dL (ref 6–24)
Bilirubin Total: 1.3 mg/dL — ABNORMAL HIGH (ref 0.0–1.2)
CALCIUM: 9.3 mg/dL (ref 8.7–10.2)
CO2: 22 mmol/L (ref 20–29)
Chloride: 102 mmol/L (ref 96–106)
Creatinine, Ser: 0.96 mg/dL (ref 0.76–1.27)
GFR, EST AFRICAN AMERICAN: 106 mL/min/{1.73_m2} (ref >59)
GFR, EST NON AFRICAN AMERICAN: 92 mL/min/{1.73_m2} (ref >59)
GLOBULIN, TOTAL: 1.9 g/dL (ref 1.5–4.5)
Glucose: 105 mg/dL — ABNORMAL HIGH (ref 65–99)
POTASSIUM: 4.4 mmol/L (ref 3.5–5.2)
SODIUM: 140 mmol/L (ref 134–144)
TOTAL PROTEIN: 6.5 g/dL (ref 6.0–8.5)

## 2017-12-19 LAB — PSA: Prostate Specific Ag, Serum: 2.6 ng/mL (ref 0.0–4.0)

## 2017-12-19 LAB — LIPID PANEL
CHOL/HDL RATIO: 5.2 ratio — AB (ref 0.0–5.0)
Cholesterol, Total: 173 mg/dL (ref 100–199)
HDL: 33 mg/dL — ABNORMAL LOW (ref >39)
LDL CALC: 117 mg/dL — AB (ref 0–99)
TRIGLYCERIDES: 114 mg/dL (ref 0–149)
VLDL Cholesterol Cal: 23 mg/dL (ref 5–40)

## 2017-12-19 LAB — HIV ANTIBODY (ROUTINE TESTING W REFLEX): HIV Screen 4th Generation wRfx: NONREACTIVE

## 2017-12-30 DIAGNOSIS — M5412 Radiculopathy, cervical region: Secondary | ICD-10-CM | POA: Diagnosis not present

## 2017-12-30 DIAGNOSIS — M5030 Other cervical disc degeneration, unspecified cervical region: Secondary | ICD-10-CM | POA: Diagnosis not present

## 2018-02-02 ENCOUNTER — Encounter: Payer: Self-pay | Admitting: Gastroenterology

## 2018-02-02 ENCOUNTER — Ambulatory Visit: Payer: BLUE CROSS/BLUE SHIELD | Admitting: Gastroenterology

## 2018-02-02 ENCOUNTER — Other Ambulatory Visit: Payer: Self-pay

## 2018-02-02 VITALS — BP 148/72 | HR 81 | Ht 69.0 in | Wt 215.8 lb

## 2018-02-02 DIAGNOSIS — K22719 Barrett's esophagus with dysplasia, unspecified: Secondary | ICD-10-CM | POA: Diagnosis not present

## 2018-02-02 DIAGNOSIS — Z1211 Encounter for screening for malignant neoplasm of colon: Secondary | ICD-10-CM

## 2018-02-02 DIAGNOSIS — K219 Gastro-esophageal reflux disease without esophagitis: Secondary | ICD-10-CM

## 2018-02-02 NOTE — Addendum Note (Signed)
Addended by: Peggye Ley on: 02/02/2018 03:31 PM   Modules accepted: Orders

## 2018-02-02 NOTE — Progress Notes (Signed)
[name] MD, MRCP(U.K) [address]  [place]  [place], [place] [address]  Main: [phone]  Fax: [phone]   Gastroenterology Consultation  Referring Provider:     Glean Hess, MD Primary Care Physician:  Glean Hess, MD Primary Gastroenterologist:  Dr. 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 y/o male referred for consultation & management  by Dr. Army Melia, Jesse Sans, MD.     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   Smoker - 30 years.        Past Medical History:  Diagnosis Date  . Arthritis    osteo - hips  . Bone spur   . GERD (gastroesophageal reflux disease)   . Wears dentures    partial upper and lower    Past Surgical History:  Procedure Laterality Date  . ETHMOIDECTOMY Bilateral 08/08/2015   Procedure: TOTAL ETHMOIDECTOMY;  Surgeon: Carloyn Manner, MD;  Location: [facility];  Service: ENT;  Laterality: Bilateral;  . FRONTAL SINUS EXPLORATION Left 08/08/2015   Procedure: FRONTAL SINUS EXPLORATION;  Surgeon: Carloyn Manner, MD;  Location: [facility];  Service: ENT;  Laterality: Left;  . IMAGE GUIDED SINUS SURGERY N/A 08/08/2015   Procedure: IMAGE GUIDED SINUS SURGERY;  Surgeon: Carloyn Manner, MD;  Location: [facility];  Service: ENT;  Laterality: N/A;  GAVE DISK TO [facility]  12/8  . MAXILLARY ANTROSTOMY Bilateral 08/08/2015   Procedure: MAXILLARY ANTROSTOMY;  Surgeon: Carloyn Manner, MD;  Location: [facility];  Service: ENT;  Laterality: Bilateral;  . MICROLARYNGOSCOPY     with biopsy  . NASAL TURBINATE REDUCTION Bilateral 08/08/2015   Procedure: TURBINATE REDUCTION/SUBMUCOSAL RESECTION;  Surgeon: Carloyn Manner, MD;  Location: [facility];  Service: ENT;  Laterality: Bilateral;  . SEPTOPLASTY N/A 08/08/2015   Procedure: SEPTOPLASTY;  Surgeon: Carloyn Manner, MD;  Location: [facility];  Service: ENT;  Laterality: N/A;  . SPHENOIDECTOMY Left 08/08/2015   Procedure: Coralee Pesa;  Surgeon: Carloyn Manner, MD;  Location: [facility];  Service: ENT;  Laterality: Left;  . UPPER GASTROINTESTINAL ENDOSCOPY  2013  . WISDOM TOOTH EXTRACTION      Prior to Admission medications   Medication Sig Start Date End Date Taking? Authorizing Provider  dexlansoprazole (DEXILANT) 60 MG capsule Take 1 capsule (60 mg total) by mouth 2 (two) times daily. 12/18/17  Yes Glean Hess, MD  fluticasone Asencion Islam) 50 MCG/ACT nasal spray SPRAY TWICE IN EACH NOSTRIL ONCE DAILY 01/07/18  Yes [provider]  Multiple Vitamin (MULTIVITAMIN) tablet Take 1 tablet by mouth daily.   Yes [provider]  PARoxetine (PAXIL-CR) 25 MG 24 hr tablet Take 1 tablet (25 mg total) by mouth daily. AM 06/09/17  Yes Glean Hess, MD  Family History  Problem Relation Age of Onset  . Diabetes Mother   . Arthritis Mother   . Heart disease Father 76  . Arthritis Father   . Hyperlipidemia Father   . Hypertension Father   . Pancreatic cancer Brother      Social History   Tobacco Use  . Smoking status: Current Every Day Smoker    Packs/day: 1.00    Years: 30.00    Pack years: 30.00    Types: Cigarettes  . Smokeless tobacco: Never Used  Substance Use Topics  . Alcohol use: Yes    Comment: 2 beers/month  . Drug use: No     Allergies as of 02/02/2018 - Review Complete 02/02/2018  Allergen Reaction Noted  . Atorvastatin Other (See Comments) 06/02/2017  . Ezetimibe Other (See Comments) 08/19/2017  . Pravachol [pravastatin] Other (See Comments) 12/19/2017    Review of Systems:    All systems reviewed and negative except where noted in HPI.   Physical Exam:  BP (!) 148/72   Pulse 81   Ht 5\' 9"  (1.753 m)   Wt 215 lb 12.8 oz (97.9 kg)   BMI 31.87 kg/m  No LMP for male patient. Psych:  Alert and cooperative. Normal mood and affect. General:   Alert,  Well-developed, well-nourished, pleasant and cooperative in NAD Head:  Normocephalic and atraumatic. Eyes:  Sclera clear, no icterus.   Conjunctiva pink. Ears:  Normal auditory acuity. Nose:  No deformity, discharge, or lesions. Mouth:  No deformity or lesions,oropharynx pink & moist. Neck:  Supple; no masses or thyromegaly. Lungs:  Respirations even and unlabored.  Clear throughout to auscultation.   No wheezes, crackles, or rhonchi. No acute distress. Heart:  Regular rate and rhythm; no murmurs, clicks, rubs, or gallops. Abdomen:  Normal bowel sounds.  No bruits.  Soft, non-tender and non-distended without masses, hepatosplenomegaly or hernias noted.  No guarding or rebound tenderness.    Neurologic:  Alert and oriented x3;  grossly normal neurologically. Skin:  Intact without significant lesions or rashes. No jaundice. Lymph Nodes:  No significant cervical adenopathy. Psych:  Alert and cooperative. Normal mood and affect.  Imaging Studies: No results found.  Assessment and Plan:   David Kennedy is a 52 y.o. 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   Plan   1. GERD : Counseled on life style changes, suggest to use PPI first thing in the morning on empty stomach and eat 30 minutes after. Advised on the use of a wedge pillow at night , avoid meals  for 2 hours prior to bed time. Weight loss ,.Discussed the risks and benefits of long term PPI use including but not limited to bone loss, chronic kidney disease, infections , low magnesium . Aim to use at the lowest dose for the shortest period of time . Balancing risks vs benefits , in his case benefits exceed risks.   2. Colonoscopy   I have discussed alternative options, risks & benefits,  which include, but are not limited to, bleeding, infection, perforation,respiratory complication & drug reaction.  The patient agrees with this plan & written consent will be obtained.     GERD patient information provided.     Follow up in 8 weeks   Dr [name] MD,MRCP(U.K)

## 2018-02-26 ENCOUNTER — Encounter: Payer: Self-pay | Admitting: Anesthesiology

## 2018-02-26 ENCOUNTER — Ambulatory Visit: Payer: BLUE CROSS/BLUE SHIELD | Admitting: Certified Registered Nurse Anesthetist

## 2018-02-26 ENCOUNTER — Ambulatory Visit
Admission: RE | Admit: 2018-02-26 | Discharge: 2018-02-26 | Disposition: A | Discharge status: Home / Self Care | Payer: BLUE CROSS/BLUE SHIELD | Source: Ambulatory Visit | Attending: Gastroenterology | Admitting: Gastroenterology

## 2018-02-26 ENCOUNTER — Encounter
Admission: RE | Disposition: A | Discharge status: Home / Self Care | Payer: Self-pay | Source: Ambulatory Visit | Attending: Gastroenterology

## 2018-02-26 DIAGNOSIS — D123 Benign neoplasm of transverse colon: Secondary | ICD-10-CM

## 2018-02-26 DIAGNOSIS — K635 Polyp of colon: Secondary | ICD-10-CM | POA: Diagnosis not present

## 2018-02-26 DIAGNOSIS — F17210 Nicotine dependence, cigarettes, uncomplicated: Secondary | ICD-10-CM | POA: Insufficient documentation

## 2018-02-26 DIAGNOSIS — K640 First degree hemorrhoids: Secondary | ICD-10-CM | POA: Insufficient documentation

## 2018-02-26 DIAGNOSIS — D122 Benign neoplasm of ascending colon: Secondary | ICD-10-CM

## 2018-02-26 DIAGNOSIS — Z1211 Encounter for screening for malignant neoplasm of colon: Secondary | ICD-10-CM

## 2018-02-26 DIAGNOSIS — K2270 Barrett's esophagus without dysplasia: Secondary | ICD-10-CM | POA: Insufficient documentation

## 2018-02-26 DIAGNOSIS — Z79899 Other long term (current) drug therapy: Secondary | ICD-10-CM | POA: Diagnosis not present

## 2018-02-26 DIAGNOSIS — K5730 Diverticulosis of large intestine without perforation or abscess without bleeding: Secondary | ICD-10-CM | POA: Insufficient documentation

## 2018-02-26 DIAGNOSIS — K648 Other hemorrhoids: Secondary | ICD-10-CM | POA: Diagnosis not present

## 2018-02-26 DIAGNOSIS — K219 Gastro-esophageal reflux disease without esophagitis: Secondary | ICD-10-CM | POA: Diagnosis not present

## 2018-02-26 DIAGNOSIS — K5790 Diverticulosis of intestine, part unspecified, without perforation or abscess without bleeding: Secondary | ICD-10-CM | POA: Diagnosis not present

## 2018-02-26 DIAGNOSIS — D125 Benign neoplasm of sigmoid colon: Secondary | ICD-10-CM | POA: Diagnosis not present

## 2018-02-26 DIAGNOSIS — K228 Other specified diseases of esophagus: Secondary | ICD-10-CM | POA: Diagnosis not present

## 2018-02-26 HISTORY — PX: ESOPHAGOGASTRODUODENOSCOPY (EGD) WITH PROPOFOL: SHX5813

## 2018-02-26 HISTORY — PX: COLONOSCOPY WITH PROPOFOL: SHX5780

## 2018-02-26 SURGERY — COLONOSCOPY WITH PROPOFOL
Anesthesia: General

## 2018-02-26 MED ORDER — PROPOFOL 500 MG/50ML IV EMUL
INTRAVENOUS | Status: AC
  Filled 2018-02-26: qty 50

## 2018-02-26 MED ORDER — LIDOCAINE HCL (PF) 1 % IJ SOLN
2.0000 mL | Freq: Once | INTRAMUSCULAR | Status: AC
  Administered 2018-02-26: 0.3 mL via INTRADERMAL

## 2018-02-26 MED ORDER — LIDOCAINE HCL (PF) 2 % IJ SOLN
INTRAMUSCULAR | Status: AC
  Filled 2018-02-26: qty 10

## 2018-02-26 MED ORDER — PROPOFOL 500 MG/50ML IV EMUL
INTRAVENOUS | Status: DC | PRN
  Administered 2018-02-26: 150 ug/kg/min via INTRAVENOUS

## 2018-02-26 MED ORDER — PROPOFOL 10 MG/ML IV BOLUS
INTRAVENOUS | Status: AC
  Filled 2018-02-26: qty 20

## 2018-02-26 MED ORDER — LIDOCAINE HCL (PF) 1 % IJ SOLN
INTRAMUSCULAR | Status: AC
  Administered 2018-02-26: 0.3 mL via INTRADERMAL
  Filled 2018-02-26: qty 2

## 2018-02-26 MED ORDER — PROPOFOL 10 MG/ML IV BOLUS
INTRAVENOUS | Status: DC | PRN
  Administered 2018-02-26: 80 mg via INTRAVENOUS
  Administered 2018-02-26 (×2): 30 mg via INTRAVENOUS
  Administered 2018-02-26 (×2): 20 mg via INTRAVENOUS

## 2018-02-26 MED ORDER — LIDOCAINE HCL (CARDIAC) PF 100 MG/5ML IV SOSY
PREFILLED_SYRINGE | INTRAVENOUS | Status: DC | PRN
  Administered 2018-02-26: 50 mg via INTRAVENOUS

## 2018-02-26 MED ORDER — SODIUM CHLORIDE 0.9 % IV SOLN
INTRAVENOUS | Status: DC
  Administered 2018-02-26: 1000 mL via INTRAVENOUS

## 2018-02-26 NOTE — Anesthesia Post-op Follow-up Note (Signed)
Anesthesia QCDR form completed.        

## 2018-02-26 NOTE — Anesthesia Preprocedure Evaluation (Signed)
Anesthesia Evaluation  Patient identified by MRN, date of birth, ID band Patient awake    Reviewed: Allergy & Precautions, H&P , NPO status , Patient's Chart, lab work & pertinent test results, reviewed documented beta blocker date and time   Airway Mallampati: II  TM Distance: >3 FB Neck ROM: full    Dental  (+) Partial Upper, Partial Lower, Dental Advidsory Given, Missing   Pulmonary neg shortness of breath, neg COPD, neg recent URI, Current Smoker,           Cardiovascular Exercise Tolerance: Good negative cardio ROS       Neuro/Psych PSYCHIATRIC DISORDERS Anxiety negative neurological ROS     GI/Hepatic Neg liver ROS, GERD  ,  Endo/Other  negative endocrine ROS  Renal/GU negative Renal ROS  negative genitourinary   Musculoskeletal   Abdominal   Peds  Hematology negative hematology ROS (+)   Anesthesia Other Findings Past Medical History: No date: Arthritis     Comment:  osteo - hips No date: Bone spur No date: GERD (gastroesophageal reflux disease) No date: Wears dentures     Comment:  partial upper and lower   Reproductive/Obstetrics negative OB ROS                             Anesthesia Physical Anesthesia Plan  ASA: II  Anesthesia Plan: General   Post-op Pain Management:    Induction: Intravenous  PONV Risk Score and Plan: 1 and Propofol infusion and TIVA  Airway Management Planned: Nasal Cannula  Additional Equipment:   Intra-op Plan:   Post-operative Plan:   Informed Consent: I have reviewed the patients History and Physical, chart, labs and discussed the procedure including the risks, benefits and alternatives for the proposed anesthesia with the patient or authorized representative who has indicated his/her understanding and acceptance.   Dental Advisory Given  Plan Discussed with: Anesthesiologist, CRNA and Surgeon  Anesthesia Plan Comments:          Anesthesia Quick Evaluation

## 2018-02-26 NOTE — Anesthesia Procedure Notes (Signed)
Date/Time: 02/26/2018 10:36 AM Performed by: Johnna Acosta, CRNA Pre-anesthesia Checklist: Patient identified, Emergency Drugs available, Suction available, Patient being monitored and Timeout performed Patient Re-evaluated:Patient Re-evaluated prior to induction Oxygen Delivery Method: Nasal cannula Preoxygenation: Pre-oxygenation with 100% oxygen

## 2018-02-26 NOTE — Op Note (Addendum)
[facility] Gastroenterology Patient Name: David Kennedy Procedure Date: 02/26/2018 10:25 AM MRN: [id number] Account #: [id number] Date of Birth: [birth year] Admit Type: Outpatient Age: 52 Room: [facility] ENDO ROOM 4 Gender: Male Note Status: Finalized Procedure:            Colonoscopy Indications:          Screening for colorectal malignant neoplasm Providers:            [name] MD, MD Referring MD:         Halina Maidens, MD (Referring MD) Medicines:            Monitored Anesthesia Care Complications:        No immediate complications. Procedure:            Pre-Anesthesia Assessment:                       - Prior to the procedure, a History and Physical was                        performed, and patient medications, allergies and                        sensitivities were reviewed. The patient's tolerance of                        previous anesthesia was reviewed.                       - The risks and benefits of the procedure and the                        sedation options and risks were discussed with the                        patient. All questions were answered and informed                        consent was obtained.                       - After reviewing the risks and benefits, the patient                        was deemed in satisfactory condition to undergo the                        procedure.                       - ASA Grade Assessment: II - A patient with mild                        systemic disease.                       After obtaining informed consent, the colonoscope was                        passed under direct vision. Throughout the procedure,                        the  patient's blood pressure, pulse, and oxygen                        saturations were monitored continuously. The                        Colonoscope was introduced through the anus and                        advanced to the the cecum, identified by the   appendiceal orifice, IC valve and transillumination.                        The colonoscopy was performed with ease. The patient                        tolerated the procedure well. The quality of the bowel                        preparation was adequate. Findings:      The perianal and digital rectal examinations were normal.      Multiple small-mouthed diverticula were found in the sigmoid colon.      Non-bleeding internal hemorrhoids were found during retroflexion. The       hemorrhoids were medium-sized and Grade I (internal hemorrhoids that do       not prolapse).      A 10 mm polyp was found in the ascending colon. The polyp was sessile.       The polyp was removed with a cold snare. Resection and retrieval were       complete.      Five sessile polyps were found in the sigmoid colon and transverse       colon. The polyps were 3 to 4 mm in size. These polyps were removed with       a cold biopsy forceps. Resection and retrieval were complete.      The exam was otherwise without abnormality on direct and retroflexion       views. Impression:           - Diverticulosis in the sigmoid colon.                       - Non-bleeding internal hemorrhoids.                       - One 10 mm polyp in the ascending colon, removed with                        a cold biopsy forceps. Resected and retrieved.                       - Five 3 to 4 mm polyps in the sigmoid colon and in the                        transverse colon, removed with a cold biopsy forceps.                        Resected and retrieved.                       - The examination was  otherwise normal on direct and                        retroflexion views. Recommendation:       - Discharge patient to home (with escort).                       - Resume previous diet.                       - Continue present medications.                       - Await pathology results.                       - Repeat colonoscopy in 3 years for  surveillance. Procedure Code(s):    --- Professional ---                       [phone], Colonoscopy, flexible; with removal of tumor(s),                        polyp(s), or other lesion(s) by snare technique                       45380, 53, Colonoscopy, flexible; with biopsy, single                        or multiple Diagnosis Code(s):    --- Professional ---                       Z12.11, Encounter for screening for malignant neoplasm                        of colon                       K64.0, First degree hemorrhoids                       D12.2, Benign neoplasm of ascending colon                       D12.3, Benign neoplasm of transverse colon (hepatic                        flexure or splenic flexure)                       D12.5, Benign neoplasm of sigmoid colon                       K57.30, Diverticulosis of large intestine without                        perforation or abscess without bleeding CPT copyright 2017 American Medical Association. All rights reserved. The codes documented in this report are preliminary and upon coder review may  be revised to meet current compliance requirements. [name], MD [name] MD, MD 02/26/2018 11:11:40 AM This report has been signed electronically. Number of Addenda: 0 Note Initiated On: 02/26/2018 10:25 AM Scope Withdrawal Time: 0 hours 15 minutes 8 seconds  Total Procedure Duration: 0  hours 16 minutes 59 seconds       [facility]

## 2018-02-26 NOTE — H&P (Signed)
David Bellows, MD [address], [place], [place], [place], [address] [address], [place], [place], [place], [address] Phone: [phone]  Fax: [phone]  Primary Care Physician:  Glean Hess, MD   Pre-Procedure History & Physical: HPI:  David Kennedy is a 52 y.o. male is here for an endoscopy and colonoscopy    Past Medical History:  Diagnosis Date  . Arthritis    osteo - hips  . Bone spur   . GERD (gastroesophageal reflux disease)   . Wears dentures    partial upper and lower    Past Surgical History:  Procedure Laterality Date  . ETHMOIDECTOMY Bilateral 08/08/2015   Procedure: TOTAL ETHMOIDECTOMY;  Surgeon: Carloyn Manner, MD;  Location: [facility];  Service: ENT;  Laterality: Bilateral;  . FRONTAL SINUS EXPLORATION Left 08/08/2015   Procedure: FRONTAL SINUS EXPLORATION;  Surgeon: Carloyn Manner, MD;  Location: [facility];  Service: ENT;  Laterality: Left;  . IMAGE GUIDED SINUS SURGERY N/A 08/08/2015   Procedure: IMAGE GUIDED SINUS SURGERY;  Surgeon: Carloyn Manner, MD;  Location: [facility];  Service: ENT;  Laterality: N/A;  GAVE DISK TO CECE 12/8  . MAXILLARY ANTROSTOMY Bilateral 08/08/2015   Procedure: MAXILLARY ANTROSTOMY;  Surgeon: Carloyn Manner, MD;  Location: [facility];  Service: ENT;  Laterality: Bilateral;  . MICROLARYNGOSCOPY     with biopsy  . NASAL TURBINATE REDUCTION Bilateral 08/08/2015   Procedure: TURBINATE REDUCTION/SUBMUCOSAL RESECTION;  Surgeon: Carloyn Manner, MD;  Location: [facility];  Service: ENT;  Laterality: Bilateral;  . SEPTOPLASTY N/A 08/08/2015   Procedure: SEPTOPLASTY;  Surgeon: Carloyn Manner, MD;  Location: [facility];  Service: ENT;  Laterality: N/A;  . SPHENOIDECTOMY Left 08/08/2015   Procedure: Coralee Pesa;  Surgeon: Carloyn Manner, MD;  Location: [facility];  Service: ENT;  Laterality: Left;  . UPPER GASTROINTESTINAL ENDOSCOPY  2013    . WISDOM TOOTH EXTRACTION      Prior to Admission medications   Medication Sig Start Date End Date Taking? Authorizing Provider  dexlansoprazole (DEXILANT) 60 MG capsule Take 1 capsule (60 mg total) by mouth 2 (two) times daily. 12/18/17   Glean Hess, MD  fluticasone Asencion Islam) 50 MCG/ACT nasal spray SPRAY TWICE IN EACH NOSTRIL ONCE DAILY 01/07/18   [provider]  Multiple Vitamin (MULTIVITAMIN) tablet Take 1 tablet by mouth daily.    [provider]  PARoxetine (PAXIL-CR) 25 MG 24 hr tablet Take 1 tablet (25 mg total) by mouth daily. AM 06/09/17   Glean Hess, MD    Allergies as of 02/02/2018 - Review Complete 02/02/2018  Allergen Reaction Noted  . Atorvastatin Other (See Comments) 06/02/2017  . Ezetimibe Other (See Comments) 08/19/2017  . Pravachol [pravastatin] Other (See Comments) 12/19/2017    Family History  Problem Relation Age of Onset  . Diabetes Mother   . Arthritis Mother   . Heart disease Father 61  . Arthritis Father   . Hyperlipidemia Father   . Hypertension Father   . Pancreatic cancer Brother     Social History   Socioeconomic History  . Marital status: Widowed    Spouse name: Not on file  . Number of children: Not on file  . Years of education: Not on file  . Highest education level: Not on file  Occupational History  . Not on file  Social Needs  . Financial resource strain: Patient refused  . Food insecurity:    Worry: Patient refused  Inability: Patient refused  . Transportation needs:    Medical: Patient refused    Non-medical: Patient refused  Tobacco Use  . Smoking status: Current Every Day Smoker    Packs/day: 1.00    Years: 30.00    Pack years: 30.00    Types: Cigarettes  . Smokeless tobacco: Never Used  Substance and Sexual Activity  . Alcohol use: Yes    Comment: 2 beers/month  . Drug use: No  . Sexual activity: Yes    Birth control/protection: None  Lifestyle  . Physical activity:    Days per  week: 3 days    Minutes per session: 40 min  . Stress: Not on file  Relationships  . Social connections:    Talks on phone: More than three times a week    Gets together: More than three times a week    Attends religious service: Never    Active member of club or organization: No    Attends meetings of clubs or organizations: Never    Relationship status: Widowed  . Intimate partner violence:    Fear of current or ex partner: Patient refused    Emotionally abused: Patient refused    Physically abused: Patient refused    Forced sexual activity: Patient refused  Other Topics Concern  . Not on file  Social History Narrative  . Not on file    Review of Systems: See HPI, otherwise negative ROS  Physical Exam: There were no vitals taken for this visit. General:   Alert,  pleasant and cooperative in NAD Head:  Normocephalic and atraumatic. Neck:  Supple; no masses or thyromegaly. Lungs:  Clear throughout to auscultation, normal respiratory effort.    Heart:  +S1, +S2, Regular rate and rhythm, No edema. Abdomen:  Soft, nontender and nondistended. Normal bowel sounds, without guarding, and without rebound.   Neurologic:  Alert and  oriented x4;  grossly normal neurologically.  Impression/Plan: David Kennedy is here for an endoscopy and colonoscopy  to be performed for screening of barrettes esophagus and for colon cancer screening average risk    Risks, benefits, limitations, and alternatives regarding endoscopy have been reviewed with the patient.  Questions have been answered.  All parties agreeable.   David Bellows, MD  02/26/2018, 10:17 AM

## 2018-02-26 NOTE — Op Note (Signed)
[facility] Gastroenterology Patient Name: David Kennedy Procedure Date: 02/26/2018 10:24 AM MRN: [id number] Account #: [id number] Date of Birth: 02/05/1966 Admit Type: Outpatient Age: 52 Room: [facility] ENDO ROOM 4 Gender: Male Note Status: Finalized Procedure:            Upper GI endoscopy Indications:          Screening for Barrett's esophagus Providers:            [name] MD, MD Referring MD:         Halina Maidens, MD (Referring MD) Medicines:            Monitored Anesthesia Care Complications:        No immediate complications. Procedure:            Pre-Anesthesia Assessment:                       - Prior to the procedure, a History and Physical was                        performed, and patient medications, allergies and                        sensitivities were reviewed. The patient's tolerance of                        previous anesthesia was reviewed.                       - The risks and benefits of the procedure and the                        sedation options and risks were discussed with the                        patient. All questions were answered and informed                        consent was obtained.                       - ASA Grade Assessment: II - A patient with mild                        systemic disease.                       After obtaining informed consent, the endoscope was                        passed under direct vision. Throughout the procedure,                        the patient's blood pressure, pulse, and oxygen                        saturations were monitored continuously. The Endoscope                        was introduced through the mouth, and advanced to the  third part of duodenum. The upper GI endoscopy was                        accomplished with ease. The patient tolerated the                        procedure well. Findings:      The Z-line was irregular and was found at the gastroesophageal  junction.       Biopsies were taken with a cold forceps for histology.      The stomach was normal.      The examined duodenum was normal.      The cardia and gastric fundus were normal on retroflexion. Impression:           - Z-line irregular, at the gastroesophageal junction.                        Biopsied.                       - Normal stomach.                       - Normal examined duodenum. Recommendation:       - Await pathology results.                       - Perform a colonoscopy today. Procedure Code(s):    --- Professional ---                       [phone], Esophagogastroduodenoscopy, flexible, transoral;                        with biopsy, single or multiple Diagnosis Code(s):    --- Professional ---                       K22.8, Other specified diseases of esophagus                       Z13.810, Encounter for screening for upper                        gastrointestinal disorder CPT copyright 2017 American Medical Association. All rights reserved. The codes documented in this report are preliminary and upon coder review may  be revised to meet current compliance requirements. [name], MD [name] MD, MD 02/26/2018 10:50:14 AM This report has been signed electronically. Number of Addenda: 0 Note Initiated On: 02/26/2018 10:24 AM      [facility]

## 2018-02-26 NOTE — Transfer of Care (Signed)
Immediate Anesthesia Transfer of Care Note  Patient: David Kennedy  Procedure(s) Performed: COLONOSCOPY WITH PROPOFOL (N/A ) ESOPHAGOGASTRODUODENOSCOPY (EGD) WITH PROPOFOL (N/A )  Patient Location: PACU  Anesthesia Type:General  Level of Consciousness: awake, alert  and oriented  Airway & Oxygen Therapy: Patient Spontanous Breathing and Patient connected to nasal cannula oxygen  Post-op Assessment: Report given to RN and Post -op Vital signs reviewed and stable  Post vital signs: Reviewed and stable  Last Vitals:  Vitals Value Taken Time  BP 120/80 02/26/2018 11:14 AM  Temp 35.6 C 02/26/2018 11:14 AM  Pulse 59 02/26/2018 11:14 AM  Resp 17 02/26/2018 11:14 AM  SpO2 100 % 02/26/2018 11:14 AM  Vitals shown include unvalidated device data.  Last Pain:  Vitals:   02/26/18 1114  TempSrc: Tympanic  PainSc:          Complications: No apparent anesthesia complications

## 2018-02-27 NOTE — Anesthesia Postprocedure Evaluation (Signed)
Anesthesia Post Note  Patient: RALPHIE LOVELADY  Procedure(s) Performed: COLONOSCOPY WITH PROPOFOL (N/A ) ESOPHAGOGASTRODUODENOSCOPY (EGD) WITH PROPOFOL (N/A )  Patient location during evaluation: Endoscopy Anesthesia Type: General Level of consciousness: awake and alert Pain management: pain level controlled Vital Signs Assessment: post-procedure vital signs reviewed and stable Respiratory status: spontaneous breathing, nonlabored ventilation, respiratory function stable and patient connected to nasal cannula oxygen Cardiovascular status: blood pressure returned to baseline and stable Postop Assessment: no apparent nausea or vomiting Anesthetic complications: no     Last Vitals:  Vitals:   02/26/18 1124 02/26/18 1134  BP: 119/84 (!) 141/117  Pulse: 60 (!) 54  Resp: 13 15  Temp:    SpO2: 98% 97%    Last Pain:  Vitals:   02/26/18 1134  TempSrc:   PainSc: 0-No pain                 Martha Clan

## 2018-02-28 ENCOUNTER — Encounter: Payer: Self-pay | Admitting: Gastroenterology

## 2018-03-01 ENCOUNTER — Encounter: Payer: Self-pay | Admitting: Internal Medicine

## 2018-03-01 DIAGNOSIS — K2270 Barrett's esophagus without dysplasia: Secondary | ICD-10-CM | POA: Insufficient documentation

## 2018-03-01 LAB — SURGICAL PATHOLOGY

## 2018-03-22 ENCOUNTER — Other Ambulatory Visit: Payer: Self-pay | Admitting: Internal Medicine

## 2018-03-22 ENCOUNTER — Telehealth: Payer: Self-pay

## 2018-03-22 DIAGNOSIS — M5412 Radiculopathy, cervical region: Secondary | ICD-10-CM | POA: Diagnosis not present

## 2018-03-22 DIAGNOSIS — R21 Rash and other nonspecific skin eruption: Secondary | ICD-10-CM

## 2018-03-22 DIAGNOSIS — M50223 Other cervical disc displacement at C6-C7 level: Secondary | ICD-10-CM | POA: Diagnosis not present

## 2018-03-22 NOTE — Telephone Encounter (Signed)
Patient called leaving a VM stating he has been seen for rash on elbows and hands in past. Reviewed chart and last appt for this was 06/02/2017. It does state in the note if it doesn't improve we can send to dermatology. Does he need appt to come back in or can we just do referral?  Please Advise.

## 2018-03-22 NOTE — Telephone Encounter (Signed)
Informed pt. Thank you

## 2018-03-22 NOTE — Telephone Encounter (Signed)
He needs to see Dermatology.  I will put in the referral.

## 2018-03-26 DIAGNOSIS — H2513 Age-related nuclear cataract, bilateral: Secondary | ICD-10-CM | POA: Diagnosis not present

## 2018-03-29 ENCOUNTER — Encounter: Payer: Self-pay | Admitting: Internal Medicine

## 2018-03-29 DIAGNOSIS — Z860100 Personal history of colon polyps, unspecified: Secondary | ICD-10-CM | POA: Insufficient documentation

## 2018-03-30 ENCOUNTER — Telehealth: Payer: Self-pay

## 2018-03-30 NOTE — Telephone Encounter (Signed)
-----   Message from [name], MD sent at 03/29/2018  2:14 PM EDT ----- David Kennedy inform patient - biopsies showed barretts esophagus with no dysplasia. Repeat EGD in 3 years. Colonoscopy polyps were benign but a few were large hyperplastic- repeat in 3 years

## 2018-03-30 NOTE — Telephone Encounter (Signed)
Pt notified of EGD and colonoscopy results. Pt has been added to the recall list.

## 2018-04-01 DIAGNOSIS — M5412 Radiculopathy, cervical region: Secondary | ICD-10-CM | POA: Diagnosis not present

## 2018-04-14 DIAGNOSIS — M6281 Muscle weakness (generalized): Secondary | ICD-10-CM | POA: Diagnosis not present

## 2018-04-14 DIAGNOSIS — M542 Cervicalgia: Secondary | ICD-10-CM | POA: Diagnosis not present

## 2018-04-28 DIAGNOSIS — M542 Cervicalgia: Secondary | ICD-10-CM | POA: Diagnosis not present

## 2018-04-28 DIAGNOSIS — F411 Generalized anxiety disorder: Secondary | ICD-10-CM | POA: Diagnosis not present

## 2018-05-03 ENCOUNTER — Other Ambulatory Visit: Payer: Self-pay | Admitting: Internal Medicine

## 2018-05-03 DIAGNOSIS — L309 Dermatitis, unspecified: Secondary | ICD-10-CM

## 2018-05-10 DIAGNOSIS — L400 Psoriasis vulgaris: Secondary | ICD-10-CM | POA: Diagnosis not present

## 2018-05-19 DIAGNOSIS — M6281 Muscle weakness (generalized): Secondary | ICD-10-CM | POA: Diagnosis not present

## 2018-05-19 DIAGNOSIS — M542 Cervicalgia: Secondary | ICD-10-CM | POA: Diagnosis not present

## 2018-06-11 ENCOUNTER — Encounter: Payer: Self-pay | Admitting: Emergency Medicine

## 2018-06-11 ENCOUNTER — Ambulatory Visit
Admission: EM | Admit: 2018-06-11 | Discharge: 2018-06-11 | Disposition: A | Discharge status: Home / Self Care | Payer: BLUE CROSS/BLUE SHIELD | Attending: Family Medicine | Admitting: Family Medicine

## 2018-06-11 ENCOUNTER — Other Ambulatory Visit: Payer: Self-pay

## 2018-06-11 DIAGNOSIS — J209 Acute bronchitis, unspecified: Secondary | ICD-10-CM | POA: Diagnosis not present

## 2018-06-11 MED ORDER — DOXYCYCLINE HYCLATE 100 MG PO CAPS: 100.0000 mg | ORAL_CAPSULE | Freq: Two times a day (BID) | ORAL | 0 refills | Status: DC

## 2018-06-11 MED ORDER — BENZONATATE 100 MG PO CAPS: 100.0000 mg | ORAL_CAPSULE | Freq: Three times a day (TID) | ORAL | 0 refills | Status: DC | PRN

## 2018-06-11 NOTE — ED Provider Notes (Signed)
[facility]    CSN: [id number] Arrival date & time: 06/11/18  0801  History   Chief Complaint Chief Complaint  Patient presents with  . Cough   HPI  52 year old male presents with cough and congestion.  2-week history of cough and chest congestion.  He reports some mild sinus congestion as well.  He states that his symptoms have been worse over the past 3 days.  Cough is worse in the morning.  Productive.  Slightly discolored sputum.  No fevers.  No chills.  No shortness of breath.  Patient is a smoker.  He has taken over-the-counter cough and cold medication without improvement.  No known exacerbating factors.  No other associated symptoms.  No other complaints.  PMH, Surgical Hx, Family Hx, Social History reviewed and updated as below.  Past Medical History:  Diagnosis Date  . Arthritis    osteo - hips  . Bone spur   . GERD (gastroesophageal reflux disease)   . Wears dentures    partial upper and lower   Patient Active Problem List   Diagnosis Date Noted  . Hx of colonic polyps 03/29/2018  . Barrett's esophagus determined by endoscopy 03/01/2018  . Cervical pain (neck) 12/18/2017  . Dermatitis 06/02/2017  . Dyslipidemia 01/27/2017  . Gastroesophageal reflux disease 07/07/2016  . Tobacco use disorder 07/07/2016  . Generalized anxiety disorder 07/07/2016   Past Surgical History:  Procedure Laterality Date  . COLONOSCOPY WITH PROPOFOL N/A 02/26/2018   Procedure: COLONOSCOPY WITH PROPOFOL;  Surgeon: [name], MD;  Location: [facility] ENDOSCOPY;  Service: Gastroenterology;  Laterality: N/A;  . ESOPHAGOGASTRODUODENOSCOPY (EGD) WITH PROPOFOL N/A 02/26/2018   Positive for Barrett's Esophagus. REPEAT 03/2021  . ETHMOIDECTOMY Bilateral 08/08/2015   Procedure: TOTAL ETHMOIDECTOMY;  Surgeon: Carloyn Manner, MD;  Location: [place];  Service: ENT;  Laterality: Bilateral;  . FRONTAL SINUS EXPLORATION Left 08/08/2015   Procedure: FRONTAL SINUS EXPLORATION;   Surgeon: Carloyn Manner, MD;  Location: [place];  Service: ENT;  Laterality: Left;  . IMAGE GUIDED SINUS SURGERY N/A 08/08/2015   Procedure: IMAGE GUIDED SINUS SURGERY;  Surgeon: Carloyn Manner, MD;  Location: [place];  Service: ENT;  Laterality: N/A;  GAVE DISK TO [facility] 12/8  . MAXILLARY ANTROSTOMY Bilateral 08/08/2015   Procedure: MAXILLARY ANTROSTOMY;  Surgeon: Carloyn Manner, MD;  Location: [place];  Service: ENT;  Laterality: Bilateral;  . MICROLARYNGOSCOPY     with biopsy  . NASAL TURBINATE REDUCTION Bilateral 08/08/2015   Procedure: TURBINATE REDUCTION/SUBMUCOSAL RESECTION;  Surgeon: Carloyn Manner, MD;  Location: [place];  Service: ENT;  Laterality: Bilateral;  . SEPTOPLASTY N/A 08/08/2015   Procedure: SEPTOPLASTY;  Surgeon: Carloyn Manner, MD;  Location: [place];  Service: ENT;  Laterality: N/A;  . SPHENOIDECTOMY Left 08/08/2015   Procedure: Coralee Pesa;  Surgeon: Carloyn Manner, MD;  Location: [place];  Service: ENT;  Laterality: Left;  . UPPER GASTROINTESTINAL ENDOSCOPY  2013  . WISDOM TOOTH EXTRACTION     Home Medications    Prior to Admission medications   Medication Sig Start Date End Date Taking? Authorizing Provider  dexlansoprazole (DEXILANT) 60 MG capsule Take 1 capsule (60 mg total) by mouth 2 (two) times daily. 12/18/17  Yes Glean Hess, MD  fluticasone Asencion Islam) 50 MCG/ACT nasal spray SPRAY TWICE IN EACH NOSTRIL ONCE DAILY 01/07/18  Yes [provider]  Multiple Vitamin (MULTIVITAMIN) tablet Take 1 tablet by mouth daily.   Yes [provider]  PARoxetine (PAXIL-CR)  25 MG 24 hr tablet Take 1 tablet (25 mg total) by mouth daily. AM 06/09/17  Yes Glean Hess, MD  benzonatate (TESSALON) 100 MG capsule Take 1 capsule (100 mg total) by mouth 3 (three) times daily as needed. 06/11/18   Coral Spikes, DO  doxycycline (VIBRAMYCIN) 100 MG capsule Take 1 capsule (100 mg  total) by mouth 2 (two) times daily. 06/11/18   Coral Spikes, DO   Family History Family History  Problem Relation Age of Onset  . Diabetes Mother   . Arthritis Mother   . Heart disease Father 23  . Arthritis Father   . Hyperlipidemia Father   . Hypertension Father   . Pancreatic cancer Brother    Social History Social History   Tobacco Use  . Smoking status: Current Every Day Smoker    Packs/day: 1.00    Years: 30.00    Pack years: 30.00    Types: Cigarettes  . Smokeless tobacco: Never Used  Substance Use Topics  . Alcohol use: Yes    Comment: 2 beers/month  . Drug use: No   Allergies   Atorvastatin; Ezetimibe; and Pravachol [pravastatin]  Review of Systems Review of Systems  Constitutional: Negative for fever.  HENT: Positive for congestion.   Respiratory: Positive for cough.    Physical Exam Triage Vital Signs ED Triage Vitals  Enc Vitals Group     BP 06/11/18 0810 (!) 132/91     Pulse Rate 06/11/18 0810 60     Resp 06/11/18 0810 16     Temp 06/11/18 0810 97.8 F (36.6 C)     Temp Source 06/11/18 0810 Oral     SpO2 06/11/18 0810 97 %     Weight 06/11/18 0807 215 lb (97.5 kg)     Height 06/11/18 0807 5\' 9"  (1.753 m)     Head Circumference --      Peak Flow --      Pain Score 06/11/18 0807 0     Pain Loc --      Pain Edu? --      Excl. in [place]? --    Updated Vital Signs BP (!) 132/91 (BP Location: Left Arm)   Pulse 60   Temp 97.8 F (36.6 C) (Oral)   Resp 16   Ht 5\' 9"  (1.753 m)   Wt 97.5 kg   SpO2 97%   BMI 31.75 kg/m   Visual Acuity Right Eye Distance:   Left Eye Distance:   Bilateral Distance:    Right Eye Near:   Left Eye Near:    Bilateral Near:     Physical Exam  Constitutional: He is oriented to person, place, and time. He appears well-developed. No distress.  HENT:  Mouth/Throat: Oropharynx is clear and moist.  Cardiovascular: Normal rate and regular rhythm.  Pulmonary/Chest: Effort normal and breath sounds normal. He has no  wheezes. He has no rales.  Neurological: He is alert and oriented to person, place, and time.  Psychiatric: He has a normal mood and affect. His behavior is normal.  Nursing note and vitals reviewed.  UC Treatments / Results  Labs (all labs ordered are listed, but only abnormal results are displayed) Labs Reviewed - No data to display  EKG None  Radiology No results found.  Procedures Procedures (including critical care time)  Medications Ordered in UC Medications - No data to display  Initial Impression / Assessment and Plan / UC Course  I have reviewed the triage vital signs and  the nursing notes.  Pertinent labs & imaging results that were available during my care of the patient were reviewed by me and considered in my medical decision making (see chart for details).    52 year old male presents with bronchitis. Given persistent and worsening symptoms, treating with doxycycline. Tessalon for cough.  Final Clinical Impressions(s) / UC Diagnoses   Final diagnoses:  Acute bronchitis, unspecified organism   Discharge Instructions   None    ED Prescriptions    Medication Sig Dispense Auth. Provider   doxycycline (VIBRAMYCIN) 100 MG capsule Take 1 capsule (100 mg total) by mouth 2 (two) times daily. 14 capsule Terik Haughey G, DO   benzonatate (TESSALON) 100 MG capsule Take 1 capsule (100 mg total) by mouth 3 (three) times daily as needed. 30 capsule Coral Spikes, DO     Controlled Substance Prescriptions [place] Controlled Substance Registry consulted? Not Applicable   Coral Spikes, [place] 06/11/18 8412

## 2018-06-11 NOTE — ED Triage Notes (Signed)
Patient c/o cough and chest congestion for 2 weeks.  Patient denies fevers.  

## 2018-06-24 DIAGNOSIS — M5412 Radiculopathy, cervical region: Secondary | ICD-10-CM | POA: Diagnosis not present

## 2018-07-09 ENCOUNTER — Other Ambulatory Visit: Payer: Self-pay

## 2018-07-09 ENCOUNTER — Encounter
Admission: RE | Admit: 2018-07-09 | Discharge: 2018-07-09 | Disposition: A | Discharge status: Home / Self Care | Payer: BLUE CROSS/BLUE SHIELD | Source: Ambulatory Visit | Attending: Neurosurgery | Admitting: Neurosurgery

## 2018-07-09 DIAGNOSIS — Z01818 Encounter for other preprocedural examination: Secondary | ICD-10-CM | POA: Diagnosis not present

## 2018-07-09 DIAGNOSIS — Z01810 Encounter for preprocedural cardiovascular examination: Secondary | ICD-10-CM | POA: Diagnosis not present

## 2018-07-09 HISTORY — DX: Anxiety disorder, unspecified: F41.9

## 2018-07-09 HISTORY — DX: Other intervertebral disc displacement, lumbar region: M51.26

## 2018-07-09 HISTORY — DX: Chronic sinusitis, unspecified: J32.9

## 2018-07-09 LAB — BASIC METABOLIC PANEL
ANION GAP: 7 (ref 5–15)
BUN: 18 mg/dL (ref 6–20)
CHLORIDE: 104 mmol/L (ref 98–111)
CO2: 27 mmol/L (ref 22–32)
CREATININE: 0.81 mg/dL (ref 0.61–1.24)
Calcium: 9 mg/dL (ref 8.9–10.3)
GFR calc non Af Amer: >60 mL/min (ref >60)
Glucose, Bld: 96 mg/dL (ref 70–99)
POTASSIUM: 4.2 mmol/L (ref 3.5–5.1)
SODIUM: 138 mmol/L (ref 135–145)

## 2018-07-09 LAB — TYPE AND SCREEN
ABO/RH(D): A POS
Antibody Screen: NEGATIVE

## 2018-07-09 LAB — URINALYSIS, ROUTINE W REFLEX MICROSCOPIC
Bilirubin Urine: NEGATIVE
GLUCOSE, UA: NEGATIVE mg/dL
HGB URINE DIPSTICK: NEGATIVE
Ketones, ur: NEGATIVE mg/dL
Leukocytes, UA: NEGATIVE
Nitrite: NEGATIVE
Protein, ur: NEGATIVE mg/dL
Specific Gravity, Urine: 1.017 (ref 1.005–1.030)
pH: 5 (ref 5.0–8.0)

## 2018-07-09 LAB — DIFFERENTIAL
Abs Immature Granulocytes: 0.05 10*3/uL (ref 0.00–0.07)
Basophils Absolute: 0.1 10*3/uL (ref 0.0–0.1)
Basophils Relative: 2 %
EOS PCT: 4 %
Eosinophils Absolute: 0.3 10*3/uL (ref 0.0–0.5)
Immature Granulocytes: 1 %
LYMPHS ABS: 1.6 10*3/uL (ref 0.7–4.0)
LYMPHS PCT: 23 %
Monocytes Absolute: 0.5 10*3/uL (ref 0.1–1.0)
Monocytes Relative: 7 %
NEUTROS ABS: 4.4 10*3/uL (ref 1.7–7.7)
NEUTROS PCT: 63 %

## 2018-07-09 LAB — CBC
HEMATOCRIT: 45.5 % (ref 39.0–52.0)
HEMOGLOBIN: 15.4 g/dL (ref 13.0–17.0)
MCH: 29.7 pg (ref 26.0–34.0)
MCHC: 33.8 g/dL (ref 30.0–36.0)
MCV: 87.7 fL (ref 80.0–100.0)
Platelets: 234 10*3/uL (ref 150–400)
RBC: 5.19 MIL/uL (ref 4.22–5.81)
RDW: 13 % (ref 11.5–15.5)
WBC: 6.8 10*3/uL (ref 4.0–10.5)
nRBC: 0 % (ref 0.0–0.2)

## 2018-07-09 LAB — PROTIME-INR
INR: 0.94
Prothrombin Time: 12.5 seconds (ref 11.4–15.2)

## 2018-07-09 LAB — APTT: APTT: 30 s (ref 24–36)

## 2018-07-09 LAB — SURGICAL PCR SCREEN
MRSA, PCR: NEGATIVE
STAPHYLOCOCCUS AUREUS: NEGATIVE

## 2018-07-09 NOTE — Patient Instructions (Signed)
Your procedure is scheduled on:Wed. 07/21/18 Report to Day Surgery. To find out your arrival time please call [phone] between 1PM - 3PM on Tues 07/20/18.  Remember: Instructions that are not followed completely may result in serious medical risk,  up to and including death, or upon the discretion of your surgeon and anesthesiologist your  surgery may need to be rescheduled.     _X__ 1. Do not eat food after midnight the night before your procedure.                 No gum chewing or hard candies. You may drink clear liquids up to 2 hours                 before you are scheduled to arrive for your surgery- DO not drink clear                 liquids within 2 hours of the start of your surgery.                 Clear Liquids include:  water, apple juice without pulp, clear carbohydrate                 drink such as Clearfast of Gatorade, Black Coffee or Tea (Do not add                 anything to coffee or tea).  __X__2.  On the morning of surgery brush your teeth with toothpaste and water, you                may rinse your mouth with mouthwash if you wish.  Do not swallow any toothpaste of mouthwash.     _X__ 3.  No Alcohol for 24 hours before or after surgery.   _X__ 4.  Do Not Smoke or use e-cigarettes For 24 Hours Prior to Your Surgery.                 Do not use any chewable tobacco products for at least 6 hours prior to                 surgery.  ____  5.  Bring all medications with you on the day of surgery if instructed.   __x__  6.  Notify your doctor if there is any change in your medical condition      (cold, fever, infections).     Do not wear jewelry, make-up, hairpins, clips or nail polish. Do not wear lotions, powders, or perfumes. You may wear deodorant. Do not shave 48 hours prior to surgery. Men may shave face and neck. Do not bring valuables to the hospital.    [facility] is not responsible for any belongings or valuables.  Contacts,  dentures or bridgework may not be worn into surgery. Leave your suitcase in the car. After surgery it may be brought to your room. For patients admitted to the hospital, discharge time is determined by your treatment team.   Patients discharged the day of surgery will not be allowed to drive home.   Please read over the following fact sheets that you were given:     _x___ Take these medicines the morning of surgery with A SIP OF WATER:    1. dexlansoprazole (DEXILANT) 60 MG capsule  2. PARoxetine (PAXIL-CR) 25 MG 24 hr tablet  3. fluticasone (FLONASE) 50 MCG/ACT nasal spray  4.acetaminophen (TYLENOL) 500 MG tablet if needed  5.  6.  ____ Fleet Enema (as directed)   __x__ Use CHG Soap as directed  ____ Use inhalers on the day of surgery  ____ Stop metformin 2 days prior to surgery    ____ Take 1/2 of usual insulin dose the night before surgery. No insulin the morning          of surgery.   ____ Stop Coumadin/Plavix/aspirin on   _x___ Stop Anti-inflammatories ibuprofen (ADVIL,MOTRIN) 200 MG tablet on 11/27   ____ Stop supplements until after surgery.    ____ Bring C-Pap to the hospital.

## 2018-07-12 DIAGNOSIS — L400 Psoriasis vulgaris: Secondary | ICD-10-CM | POA: Diagnosis not present

## 2018-07-21 ENCOUNTER — Ambulatory Visit: Payer: BLUE CROSS/BLUE SHIELD

## 2018-07-21 ENCOUNTER — Observation Stay
Admission: RE | Admit: 2018-07-21 | Discharge: 2018-07-22 | Disposition: A | Discharge status: Home / Self Care | Payer: BLUE CROSS/BLUE SHIELD | Source: Ambulatory Visit | Attending: Neurosurgery | Admitting: Neurosurgery

## 2018-07-21 ENCOUNTER — Other Ambulatory Visit: Payer: Self-pay

## 2018-07-21 ENCOUNTER — Encounter: Payer: Self-pay | Admitting: *Deleted

## 2018-07-21 ENCOUNTER — Encounter
Admission: RE | Disposition: A | Discharge status: Home / Self Care | Payer: Self-pay | Source: Ambulatory Visit | Attending: Neurosurgery

## 2018-07-21 DIAGNOSIS — Z981 Arthrodesis status: Secondary | ICD-10-CM

## 2018-07-21 DIAGNOSIS — M5412 Radiculopathy, cervical region: Secondary | ICD-10-CM | POA: Diagnosis not present

## 2018-07-21 DIAGNOSIS — M4322 Fusion of spine, cervical region: Secondary | ICD-10-CM | POA: Diagnosis not present

## 2018-07-21 DIAGNOSIS — F17200 Nicotine dependence, unspecified, uncomplicated: Secondary | ICD-10-CM | POA: Diagnosis not present

## 2018-07-21 DIAGNOSIS — M4302 Spondylolysis, cervical region: Secondary | ICD-10-CM | POA: Diagnosis not present

## 2018-07-21 DIAGNOSIS — G960 Cerebrospinal fluid leak: Secondary | ICD-10-CM | POA: Diagnosis not present

## 2018-07-21 DIAGNOSIS — Z419 Encounter for procedure for purposes other than remedying health state, unspecified: Secondary | ICD-10-CM

## 2018-07-21 DIAGNOSIS — M4802 Spinal stenosis, cervical region: Secondary | ICD-10-CM | POA: Diagnosis not present

## 2018-07-21 HISTORY — DX: Radiculopathy, cervical region: M54.12

## 2018-07-21 HISTORY — PX: ANTERIOR CERVICAL DECOMP/DISCECTOMY FUSION: SHX1161

## 2018-07-21 LAB — ABO/RH: ABO/RH(D): A POS

## 2018-07-21 SURGERY — ANTERIOR CERVICAL DECOMPRESSION/DISCECTOMY FUSION 3 LEVELS
Anesthesia: General | Site: Neck

## 2018-07-21 MED ORDER — DEXAMETHASONE SODIUM PHOSPHATE 10 MG/ML IJ SOLN
INTRAMUSCULAR | Status: AC
  Filled 2018-07-21: qty 1

## 2018-07-21 MED ORDER — PROMETHAZINE HCL 25 MG/ML IJ SOLN: 6.2500 mg | INTRAMUSCULAR | Status: DC | PRN

## 2018-07-21 MED ORDER — SUCCINYLCHOLINE CHLORIDE 20 MG/ML IJ SOLN
INTRAMUSCULAR | Status: DC | PRN
  Administered 2018-07-21: 140 mg via INTRAVENOUS

## 2018-07-21 MED ORDER — EPHEDRINE SULFATE 50 MG/ML IJ SOLN
INTRAMUSCULAR | Status: AC
  Filled 2018-07-21: qty 1

## 2018-07-21 MED ORDER — LACTATED RINGERS IV SOLN
INTRAVENOUS | Status: DC
  Administered 2018-07-21 (×2): via INTRAVENOUS

## 2018-07-21 MED ORDER — ONDANSETRON HCL 4 MG PO TABS: 4.0000 mg | ORAL_TABLET | Freq: Four times a day (QID) | ORAL | Status: DC | PRN

## 2018-07-21 MED ORDER — FLUTICASONE PROPIONATE 50 MCG/ACT NA SUSP
2.0000 | Freq: Every day | NASAL | Status: DC
  Administered 2018-07-21: 2 via NASAL
  Filled 2018-07-21: qty 16

## 2018-07-21 MED ORDER — HYDROMORPHONE HCL 1 MG/ML IJ SOLN
INTRAMUSCULAR | Status: AC
  Filled 2018-07-21: qty 1

## 2018-07-21 MED ORDER — SODIUM CHLORIDE 0.9 % IR SOLN
Status: DC | PRN
  Administered 2018-07-21: 1000 mL

## 2018-07-21 MED ORDER — ONDANSETRON HCL 4 MG/2ML IJ SOLN
4.0000 mg | Freq: Four times a day (QID) | INTRAMUSCULAR | Status: DC | PRN
  Administered 2018-07-21: 4 mg via INTRAVENOUS
  Filled 2018-07-21: qty 2

## 2018-07-21 MED ORDER — REMIFENTANIL HCL 1 MG IV SOLR
INTRAVENOUS | Status: AC
  Filled 2018-07-21: qty 1000

## 2018-07-21 MED ORDER — PROPOFOL 10 MG/ML IV BOLUS
INTRAVENOUS | Status: AC
  Filled 2018-07-21: qty 20

## 2018-07-21 MED ORDER — BUPIVACAINE-EPINEPHRINE 0.5% -1:200000 IJ SOLN
INTRAMUSCULAR | Status: DC | PRN
  Administered 2018-07-21: 7 mL

## 2018-07-21 MED ORDER — ACETAMINOPHEN 325 MG PO TABS: 325.0000 mg | ORAL_TABLET | ORAL | Status: DC | PRN

## 2018-07-21 MED ORDER — SODIUM CHLORIDE 0.9 % IV SOLN
INTRAVENOUS | Status: DC | PRN
  Administered 2018-07-21: 30 ug/min via INTRAVENOUS

## 2018-07-21 MED ORDER — PROPOFOL 500 MG/50ML IV EMUL
INTRAVENOUS | Status: DC | PRN
  Administered 2018-07-21: 200 ug/kg/min via INTRAVENOUS

## 2018-07-21 MED ORDER — GELATIN ABSORBABLE 12-7 MM EX MISC
CUTANEOUS | Status: AC
  Filled 2018-07-21: qty 1

## 2018-07-21 MED ORDER — THROMBIN 5000 UNITS EX SOLR
CUTANEOUS | Status: AC
  Filled 2018-07-21: qty 5000

## 2018-07-21 MED ORDER — OXYCODONE HCL 5 MG/5ML PO SOLN: 5.0000 mg | Freq: Once | ORAL | Status: DC | PRN

## 2018-07-21 MED ORDER — OXYCODONE HCL 5 MG PO TABS
10.0000 mg | ORAL_TABLET | ORAL | Status: DC | PRN
  Administered 2018-07-21: 10 mg via ORAL
  Filled 2018-07-21: qty 2

## 2018-07-21 MED ORDER — BACITRACIN 50000 UNITS IM SOLR
INTRAMUSCULAR | Status: AC
  Filled 2018-07-21: qty 1

## 2018-07-21 MED ORDER — PROPOFOL 500 MG/50ML IV EMUL
INTRAVENOUS | Status: AC
  Filled 2018-07-21: qty 50

## 2018-07-21 MED ORDER — KETAMINE HCL 10 MG/ML IJ SOLN
INTRAMUSCULAR | Status: DC | PRN
  Administered 2018-07-21: 20 mg via INTRAVENOUS
  Administered 2018-07-21: 30 mg via INTRAVENOUS
  Administered 2018-07-21: 20 mg via INTRAVENOUS

## 2018-07-21 MED ORDER — SODIUM CHLORIDE (PF) 0.9 % IJ SOLN
INTRAMUSCULAR | Status: AC
  Filled 2018-07-21: qty 20

## 2018-07-21 MED ORDER — HYDROMORPHONE HCL 1 MG/ML IJ SOLN
0.2500 mg | INTRAMUSCULAR | Status: DC | PRN
  Administered 2018-07-21 (×2): 0.5 mg via INTRAVENOUS

## 2018-07-21 MED ORDER — CEFAZOLIN SODIUM-DEXTROSE 2-4 GM/100ML-% IV SOLN
INTRAVENOUS | Status: AC
  Filled 2018-07-21: qty 100

## 2018-07-21 MED ORDER — SUCCINYLCHOLINE CHLORIDE 20 MG/ML IJ SOLN
INTRAMUSCULAR | Status: AC
  Filled 2018-07-21: qty 1

## 2018-07-21 MED ORDER — DEXAMETHASONE SODIUM PHOSPHATE 4 MG/ML IJ SOLN
4.0000 mg | Freq: Three times a day (TID) | INTRAMUSCULAR | Status: DC
  Administered 2018-07-21 – 2018-07-22 (×3): 4 mg via INTRAVENOUS
  Filled 2018-07-21 (×4): qty 1

## 2018-07-21 MED ORDER — REMIFENTANIL HCL 1 MG IV SOLR
INTRAVENOUS | Status: DC | PRN
  Administered 2018-07-21: .2 ug/kg/min via INTRAVENOUS

## 2018-07-21 MED ORDER — ACETAMINOPHEN 650 MG RE SUPP: 650.0000 mg | RECTAL | Status: DC | PRN

## 2018-07-21 MED ORDER — SODIUM CHLORIDE 0.9 % IV SOLN
INTRAVENOUS | Status: DC
  Administered 2018-07-21: 19:00:00 via INTRAVENOUS

## 2018-07-21 MED ORDER — FENTANYL CITRATE (PF) 100 MCG/2ML IJ SOLN
INTRAMUSCULAR | Status: AC
  Filled 2018-07-21: qty 2

## 2018-07-21 MED ORDER — THROMBIN 5000 UNITS EX SOLR
CUTANEOUS | Status: DC | PRN
  Administered 2018-07-21: 5000 [IU] via TOPICAL

## 2018-07-21 MED ORDER — LIDOCAINE HCL (CARDIAC) PF 100 MG/5ML IV SOSY
PREFILLED_SYRINGE | INTRAVENOUS | Status: DC | PRN
  Administered 2018-07-21: 100 mg via INTRAVENOUS

## 2018-07-21 MED ORDER — NEOSTIGMINE METHYLSULFATE 10 MG/10ML IV SOLN
INTRAVENOUS | Status: AC
  Filled 2018-07-21: qty 1

## 2018-07-21 MED ORDER — BUPIVACAINE-EPINEPHRINE (PF) 0.5% -1:200000 IJ SOLN
INTRAMUSCULAR | Status: AC
  Filled 2018-07-21: qty 30

## 2018-07-21 MED ORDER — NEOSTIGMINE METHYLSULFATE 10 MG/10ML IV SOLN
INTRAVENOUS | Status: DC | PRN
  Administered 2018-07-21: 3 mg via INTRAVENOUS

## 2018-07-21 MED ORDER — SODIUM CHLORIDE 0.9% FLUSH
3.0000 mL | Freq: Two times a day (BID) | INTRAVENOUS | Status: DC
  Administered 2018-07-22: 3 mL via INTRAVENOUS

## 2018-07-21 MED ORDER — ACETAMINOPHEN 325 MG PO TABS: 650.0000 mg | ORAL_TABLET | ORAL | Status: DC | PRN

## 2018-07-21 MED ORDER — DEXAMETHASONE SODIUM PHOSPHATE 10 MG/ML IJ SOLN
INTRAMUSCULAR | Status: DC | PRN
  Administered 2018-07-21: 10 mg via INTRAVENOUS

## 2018-07-21 MED ORDER — MENTHOL 3 MG MT LOZG
1.0000 | LOZENGE | OROMUCOSAL | Status: DC | PRN
  Filled 2018-07-21: qty 9

## 2018-07-21 MED ORDER — METHOCARBAMOL 500 MG PO TABS
500.0000 mg | ORAL_TABLET | Freq: Four times a day (QID) | ORAL | Status: DC | PRN
  Administered 2018-07-21 – 2018-07-22 (×2): 500 mg via ORAL
  Filled 2018-07-21 (×2): qty 1

## 2018-07-21 MED ORDER — FENTANYL CITRATE (PF) 100 MCG/2ML IJ SOLN
INTRAMUSCULAR | Status: DC | PRN
  Administered 2018-07-21: 100 ug via INTRAVENOUS
  Administered 2018-07-21 (×2): 50 ug via INTRAVENOUS

## 2018-07-21 MED ORDER — CEFAZOLIN SODIUM-DEXTROSE 2-4 GM/100ML-% IV SOLN
2.0000 g | Freq: Once | INTRAVENOUS | Status: AC
  Administered 2018-07-21: 2 g via INTRAVENOUS

## 2018-07-21 MED ORDER — PAROXETINE HCL ER 12.5 MG PO TB24
25.0000 mg | ORAL_TABLET | Freq: Every day | ORAL | Status: DC
  Filled 2018-07-21: qty 2

## 2018-07-21 MED ORDER — OXYCODONE HCL 5 MG PO TABS: 5.0000 mg | ORAL_TABLET | ORAL | Status: DC | PRN

## 2018-07-21 MED ORDER — MEPERIDINE HCL 50 MG/ML IJ SOLN: 6.2500 mg | INTRAMUSCULAR | Status: DC | PRN

## 2018-07-21 MED ORDER — PHENOL 1.4 % MT LIQD
1.0000 | OROMUCOSAL | Status: DC | PRN
  Filled 2018-07-21: qty 177

## 2018-07-21 MED ORDER — PANTOPRAZOLE SODIUM 40 MG PO TBEC
40.0000 mg | DELAYED_RELEASE_TABLET | Freq: Every day | ORAL | Status: DC
  Filled 2018-07-21: qty 1

## 2018-07-21 MED ORDER — SODIUM CHLORIDE 0.9 % IV SOLN: 250.0000 mL | INTRAVENOUS | Status: DC

## 2018-07-21 MED ORDER — LACTATED RINGERS IV SOLN
INTRAVENOUS | Status: DC | PRN
  Administered 2018-07-21: 09:00:00 via INTRAVENOUS

## 2018-07-21 MED ORDER — ROCURONIUM BROMIDE 100 MG/10ML IV SOLN
INTRAVENOUS | Status: DC | PRN
  Administered 2018-07-21: 40 mg via INTRAVENOUS
  Administered 2018-07-21: 10 mg via INTRAVENOUS

## 2018-07-21 MED ORDER — MIDAZOLAM HCL 2 MG/2ML IJ SOLN
INTRAMUSCULAR | Status: AC
  Filled 2018-07-21: qty 2

## 2018-07-21 MED ORDER — GLYCOPYRROLATE 0.2 MG/ML IJ SOLN
INTRAMUSCULAR | Status: AC
  Filled 2018-07-21: qty 2

## 2018-07-21 MED ORDER — MIDAZOLAM HCL 2 MG/2ML IJ SOLN
INTRAMUSCULAR | Status: DC | PRN
  Administered 2018-07-21: 2 mg via INTRAVENOUS

## 2018-07-21 MED ORDER — ONDANSETRON HCL 4 MG/2ML IJ SOLN
INTRAMUSCULAR | Status: DC | PRN
  Administered 2018-07-21: 4 mg via INTRAVENOUS

## 2018-07-21 MED ORDER — METHOCARBAMOL 1000 MG/10ML IJ SOLN
500.0000 mg | Freq: Four times a day (QID) | INTRAVENOUS | Status: DC | PRN
  Filled 2018-07-21: qty 5

## 2018-07-21 MED ORDER — GLYCOPYRROLATE 0.2 MG/ML IJ SOLN
INTRAMUSCULAR | Status: DC | PRN
  Administered 2018-07-21: .4 mg via INTRAVENOUS

## 2018-07-21 MED ORDER — SODIUM CHLORIDE (PF) 0.9 % IJ SOLN
INTRAMUSCULAR | Status: AC
  Filled 2018-07-21: qty 10

## 2018-07-21 MED ORDER — SODIUM CHLORIDE 0.9% FLUSH: 3.0000 mL | INTRAVENOUS | Status: DC | PRN

## 2018-07-21 MED ORDER — HYDROMORPHONE HCL 1 MG/ML IJ SOLN: 0.5000 mg | INTRAMUSCULAR | Status: DC | PRN

## 2018-07-21 MED ORDER — ACETAMINOPHEN 500 MG PO TABS
1000.0000 mg | ORAL_TABLET | Freq: Four times a day (QID) | ORAL | Status: AC
  Administered 2018-07-21 – 2018-07-22 (×4): 1000 mg via ORAL
  Filled 2018-07-21 (×4): qty 2

## 2018-07-21 MED ORDER — OXYCODONE HCL 5 MG PO TABS: 5.0000 mg | ORAL_TABLET | Freq: Once | ORAL | Status: DC | PRN

## 2018-07-21 MED ORDER — ACETAMINOPHEN 160 MG/5ML PO SOLN
325.0000 mg | ORAL | Status: DC | PRN
  Filled 2018-07-21: qty 20.3

## 2018-07-21 MED ORDER — ACETAMINOPHEN 10 MG/ML IV SOLN: 1000.0000 mg | Freq: Once | INTRAVENOUS | Status: DC | PRN

## 2018-07-21 MED ORDER — SENNOSIDES-DOCUSATE SODIUM 8.6-50 MG PO TABS: 1.0000 | ORAL_TABLET | Freq: Every evening | ORAL | Status: DC | PRN

## 2018-07-21 MED ORDER — PROPOFOL 10 MG/ML IV BOLUS
INTRAVENOUS | Status: DC | PRN
  Administered 2018-07-21: 150 mg via INTRAVENOUS

## 2018-07-21 SURGICAL SUPPLY — 62 items
ALLOGRAFT LORDOTIC 8X11X14 (Bone Implant) ×6 IMPLANT
BAND RUBBER 3X1/6 TAN STRL (MISCELLANEOUS) ×2 IMPLANT
BLADE BOVIE TIP EXT 4 (BLADE) ×2 IMPLANT
BLADE SURG 15 STRL LF DISP TIS (BLADE) ×1 IMPLANT
BLADE SURG 15 STRL SS (BLADE) ×1
BULB RESERV EVAC DRAIN JP 100C (MISCELLANEOUS) IMPLANT
BUR NEURO DRILL SOFT 3.0X3.8M (BURR) ×2 IMPLANT
CANISTER SUCT 1200ML W/VALVE (MISCELLANEOUS) ×4 IMPLANT
CHLORAPREP W/TINT 26ML (MISCELLANEOUS) ×2 IMPLANT
COUNTER NEEDLE 20/40 LG (NEEDLE) ×2 IMPLANT
COVER LIGHT HANDLE STERIS (MISCELLANEOUS) ×4 IMPLANT
COVER WAND RF STERILE (DRAPES) ×2 IMPLANT
CRADLE LAMINECT ARM (MISCELLANEOUS) ×2 IMPLANT
CUP MEDICINE 2OZ PLAST GRAD ST (MISCELLANEOUS) ×4 IMPLANT
DERMABOND ADVANCED (GAUZE/BANDAGES/DRESSINGS) ×1
DERMABOND ADVANCED .7 DNX12 (GAUZE/BANDAGES/DRESSINGS) ×1 IMPLANT
DRAIN CHANNEL JP 10F RND 20C F (MISCELLANEOUS) IMPLANT
DRAPE C-ARM 42X72 X-RAY (DRAPES) ×4 IMPLANT
DRAPE INCISE IOBAN 66X45 STRL (DRAPES) ×2 IMPLANT
DRAPE LAPAROTOMY 77X122 PED (DRAPES) ×2 IMPLANT
DRAPE MICROSCOPE SPINE 48X150 (DRAPES) ×2 IMPLANT
DRAPE POUCH INSTRU U-SHP 10X18 (DRAPES) ×2 IMPLANT
DRAPE SHEET LG 3/4 BI-LAMINATE (DRAPES) ×2 IMPLANT
DRAPE SURG 17X11 SM STRL (DRAPES) ×8 IMPLANT
DRAPE TABLE BACK 80X90 (DRAPES) ×2 IMPLANT
ELECT CAUTERY BLADE TIP 2.5 (TIP) ×2
ELECTRODE CAUTERY BLDE TIP 2.5 (TIP) ×1 IMPLANT
FEE INTRAOP MONITOR IMPULS NCS (MISCELLANEOUS) IMPLANT
FRAME EYE SHIELD (PROTECTIVE WEAR) ×2 IMPLANT
GAUZE SPONGE 4X4 12PLY STRL (GAUZE/BANDAGES/DRESSINGS) ×2 IMPLANT
GLOVE SURG SYN 7.0 (GLOVE) ×4 IMPLANT
GLOVE SURG SYN 8.5  E (GLOVE) ×3
GLOVE SURG SYN 8.5 E (GLOVE) ×3 IMPLANT
GOWN SRG XL LVL 3 NONREINFORCE (GOWNS) ×1 IMPLANT
GOWN STRL NON-REIN TWL XL LVL3 (GOWNS) ×1
GOWN STRL REUS W/ TWL LRG LVL3 (GOWN DISPOSABLE) ×1 IMPLANT
GOWN STRL REUS W/TWL LRG LVL3 (GOWN DISPOSABLE) ×1
GRADUATE 1200CC STRL 31836 (MISCELLANEOUS) ×2 IMPLANT
INTRAOP MONITOR FEE IMPULS NCS (MISCELLANEOUS)
INTRAOP MONITOR FEE IMPULSE (MISCELLANEOUS)
IV CATH ANGIO 12GX3 LT BLUE (NEEDLE) ×2 IMPLANT
KIT TURNOVER KIT A (KITS) ×2 IMPLANT
MARKER SKIN DUAL TIP RULER LAB (MISCELLANEOUS) ×4 IMPLANT
NEEDLE HYPO 22GX1.5 SAFETY (NEEDLE) ×2 IMPLANT
NS IRRIG 1000ML POUR BTL (IV SOLUTION) ×2 IMPLANT
PACK LAMINECTOMY NEURO (CUSTOM PROCEDURE TRAY) ×2 IMPLANT
PIN CASPAR 14 (PIN) ×1 IMPLANT
PIN CASPAR 14MM (PIN) ×2
PLATE ARCHON 3-LEVEL 60MM (Plate) ×2 IMPLANT
SCREW ARCHON ST VAR 4.0X17MM (Screw) ×16 IMPLANT
SPOGE SURGIFLO 8M (HEMOSTASIS) ×1
SPONGE KITTNER 5P (MISCELLANEOUS) ×4 IMPLANT
SPONGE SURGIFLO 8M (HEMOSTASIS) ×1 IMPLANT
STAPLER SKIN PROX 35W (STAPLE) IMPLANT
STRIP CLOSURE SKIN 1/2X4 (GAUZE/BANDAGES/DRESSINGS) IMPLANT
SUT V-LOC 90 ABS DVC 3-0 CL (SUTURE) ×2 IMPLANT
SUT VIC AB 3-0 SH 8-18 (SUTURE) ×2 IMPLANT
SYR 30ML LL (SYRINGE) ×2 IMPLANT
TAPE CLOTH 3X10 WHT NS LF (GAUZE/BANDAGES/DRESSINGS) ×2 IMPLANT
TOWEL OR 17X26 4PK STRL BLUE (TOWEL DISPOSABLE) ×8 IMPLANT
TRAY FOLEY MTR SLVR 16FR STAT (SET/KITS/TRAYS/PACK) IMPLANT
TUBING CONNECTING 10 (TUBING) ×2 IMPLANT

## 2018-07-21 NOTE — Op Note (Signed)
Indications: Mr Verde is a 52 yo male who presented with cervical radiculopathy due to extensive cervical spondylosis.  The patient failed conservative management and elected for surgical intervention.  Findings: extensive spondylosis  Preoperative Diagnosis: Cervical radiculopathy Postoperative Diagnosis: same   EBL: 150 ml IVF: 1400 ml Drains: none Disposition: Extubated and Stable to PACU Complications: none  No foley catheter was placed.   Preoperative Note:   Risks of surgery discussed include: infection, bleeding, stroke, coma, death, paralysis, CSF leak, nerve/spinal cord injury, numbness, tingling, weakness, complex regional pain syndrome, recurrent stenosis and/or disc herniation, vascular injury, development of instability, neck/back pain, need for further surgery, persistent symptoms, development of deformity, and the risks of anesthesia. The patient understood these risks and agreed to proceed.     Operative Procedure: 1. Anterior Cervical Discectomy and Fusion C4-5 including bilateral foraminotomies and end plate preparation  2. Anterior Cervical Discectomy and Fusion C5-6 including bilateral foraminotomies and end plate preparation  3. Anterior Cervical Discectomy and Fusion C6-7 including bilateral foraminotomies and end plate preparation 4. Anterior Spinal Instrumentation C4 to 7 5. Anterior arthrodesis from C4 to C7 6. Use of the operative microscope 7. Use of intraoperative flouroscopy  PROCEDURE IN DETAIL: After obtaining informed consent, the patient taken to the operating room, placed in supine position, general anesthesia induced.  The patient had a small shoulder roll placed behind their shoulders.  The patient received preop antibiotics and IV Decadron.  The patient had a neck incision outlined, was prepped and draped in usual sterile fashion. The incision was injected with local anesthetic.   An incision was opened, dissection taken down medial to the  carotid artery and jugular vein, lateral to the trachea and esophagus.  The prevertebral fascia identified and a localizing x-ray demonstrated the correct level.  The longus colli were dissected laterally, and self-retaining retractors placed to open the operative field. The microscope was then brought into the field.  With this complete, distractor pins were placed in the vertebral bodies of C4 and C6.  The distractor was placed from C4 to C6. The annuli at C4-5 and C5-6 were opened with a bovie. Curettes and pituitary rongeurs used to remove the majority of disk at each level, then the drill was used to remove the posterior osteophyte and begin the foraminotomies. The nerve hook was used to elevate the posterior longitudinal ligament, which was then removed with Kerrison rongeurs. During removal of the disc and PLL at C4-5, a small amount of clear fluid was noted underneath the disc herniation.  After removing the disc carefully, a calcified shard of disc was noted to have eroded into the dura and caused a durotomy.  Due to this, foraminal decompression at C4-5 on the right required substantial care not to enlarge the durotomy.  This was carefully performed until the R C5 nerve root was decompressed, then we moved on to C5-6.  A small piece of surgicel was covered with a thin layer of tisseel to help with sealing the tear.     The microblunt nerve hook could be passed out the foramen bilaterally at each level.   Meticulous hemostasis was obtained. After hemostasis, structural allograft was tapped behind the anterior lip of the vertebral body at C4-5 (8 mm height) and C5-6 (8 mm height).  Tisseel was then used to seal the C4-5 disc space.   The distractor was then removed, and the C4 caspar pin removed. Bone wax was used for hemostasis. An additional Caspar pin was placed at  C7. The distractor was placed from C6-7, and the annulus at C6-7 was opened using a bovie.  Curettes and pituitary rongeurs used to  remove the majority of disk, then the drill was used to remove the posterior osteophyte and begin the foraminotomies. The nerve hook was used to elevate the posterior longitudinal ligament, which was then removed with Kerrison rongeurs. The microblunt nerve hook could be passed out the foramen bilaterally.   Meticulous hemostasis was obtained.  Structural allograft coated with demineralized bone matrix was tapped behind the anterior lip of the vertebral body at C6-7 (8 mm height).  The caspar distractor was removed, and bone wax used for hemostasis at each level. The anterior osteophytes were removed.    A separate, four segment, three level plate (62 mm Nuvasive Archon) was chosen.  Two screws placed in the vertebral bodies of all four segments, respectively making sure the screws were behind the locking mechanism.  Final AP and lateral radiographs were taken.   With everything in good position, the wound was irrigated copiously with bacitracin-containing solution and meticulous hemostasis obtained.  Wound was closed in 2 layers using interrupted inverted 3-0 Vicryl sutures in the platysma and 3-0 monocryl in the dermis.  The wound was dressed with dermabond, the head of bed at 30 degrees, taken to recovery room in stable condition.  No new postop neurological deficits were identified..  All counts were correct at the end of the case. Monitoring was used throughout without any changes.   I performed the entire procedure with Marin Olp PA as an Environmental consultant.  Meade Maw MD

## 2018-07-21 NOTE — Anesthesia Procedure Notes (Signed)
Procedure Name: Intubation Date/Time: 07/21/2018 1:19 PM Performed by: Alphonsus Sias, MD Pre-anesthesia Checklist: Patient identified, Emergency Drugs available, Suction available, Patient being monitored and Timeout performed Patient Re-evaluated:Patient Re-evaluated prior to induction Oxygen Delivery Method: Circle system utilized Preoxygenation: Pre-oxygenation with 100% oxygen Induction Type: IV induction Ventilation: Mask ventilation without difficulty Laryngoscope Size: McGraph and 4 Grade View: Grade I Tube type: Oral Tube size: 7.5 mm Number of attempts: 1 Airway Equipment and Method: Stylet and Video-laryngoscopy Placement Confirmation: ETT inserted through vocal cords under direct vision,  breath sounds checked- equal and bilateral and positive ETCO2 Secured at: 22 cm Tube secured with: Tape Dental Injury: Teeth and Oropharynx as per pre-operative assessment

## 2018-07-21 NOTE — H&P (Signed)
Heart sounds normal no MRG. Chest Clear to Auscultation Bilaterally.  I have reviewed and confirmed my history and physical from 06/24/2018 with no additions or changes. Plan for ACDF C4-7.  Risks and benefits reviewed.  Heart sounds normal no MRG. Chest Clear to Auscultation Bilaterally.

## 2018-07-21 NOTE — Progress Notes (Signed)
Procedure: C4-C7 ACDF Procedure date 07/21/2018 Diagnosis: Cervical radiculopathy  History: David Kennedy is EOD 0 status post C4-C7 ACDF for cervical radiculopathy.  Weighted and postop recovery still disoriented from anesthesia but able to answer questions and obey commands. Complains of posterior neck pain and numbness. No pain, numbness, tingling in upper extremities.   Physical Exam: Vitals:   07/21/18 0900 07/21/18 1657  BP: (!) 137/92   Pulse: 66   Resp: 16   Temp: 97.8 F (36.6 C) (!) (P) 97.2 F (36.2 C)  SpO2: 96%     AA Ox3 Neck: Incision site intact. Trachea deviation appears resolved. Noted swelling more prominent at the lateral aspect.  Strength: 5/5 lower extremities, at least 4-/5 upper extremities Sensation: Intact and symmetric upper and lower  Data:  No results for input(s): NA, K, CL, CO2, BUN, CREATININE, LABGLOM, GLUCOSE, CALCIUM in the last 168 hours. No results for input(s): AST, ALT, ALKPHOS in the last 168 hours.  Invalid input(s): TBILI   No results for input(s): WBC, HGB, HCT, PLT in the last 168 hours. No results for input(s): APTT, INR in the last 168 hours.       Other tests/results: Cervical  x-rays pending  Assessment/Plan:  David Kennedy is POD 0 status post C4-C7 ACDF for cervical radiculopathy.  He is recovering well.  We will continue pain control with Tylenol, Robaxin, oxycodone as needed.  Prevent swallowing issues with dexamethasone.  - Maintain head at greater than 90 degrees angle due to CSF leak - mobilize - pain control - DVT prophylaxis  Marin Olp PA-C Department of Neurosurgery

## 2018-07-21 NOTE — Progress Notes (Signed)
Pt admitted to unit from PACU. Pt is A&Ox4, VSS. Pt oriented to room and plan of care. Pt is neuro intact except for tingling in right hand and right foot. Estill Bamberg PA notified of above and that hospital beds only raise to 65 degrees- instructed to put pillows behind pt and try to keep as close to 90 degrees as possible.

## 2018-07-21 NOTE — Anesthesia Post-op Follow-up Note (Signed)
Anesthesia QCDR form completed.        

## 2018-07-21 NOTE — Anesthesia Preprocedure Evaluation (Signed)
Anesthesia Evaluation  Patient identified by MRN, date of birth, ID band Patient awake    Reviewed: Allergy & Precautions, H&P , NPO status , reviewed documented beta blocker date and time   Airway Mallampati: II  TM Distance: >3 FB Neck ROM: limited    Dental  (+) Missing, Chipped, Partial Upper, Partial Lower   Pulmonary Current Smoker,    Pulmonary exam normal        Cardiovascular Normal cardiovascular exam     Neuro/Psych PSYCHIATRIC DISORDERS Anxiety    GI/Hepatic GERD  Medicated and Controlled,  Endo/Other    Renal/GU      Musculoskeletal  (+) Arthritis ,   Abdominal   Peds  Hematology   Anesthesia Other Findings Past Medical History: No date: Anxiety No date: Arthritis     Comment:  osteo - hips No date: Bone spur No date: GERD (gastroesophageal reflux disease) No date: Herniated intervertebral disc of lumbar spine No date: Sinusitis No date: Wears dentures     Comment:  partial upper and lower Past Surgical History: 02/26/2018: COLONOSCOPY WITH PROPOFOL; N/A     Comment:  Procedure: COLONOSCOPY WITH PROPOFOL;  Surgeon: [name], MD;  Location: [facility] ENDOSCOPY;  Service:               Gastroenterology;  Laterality: N/A; 02/26/2018: ESOPHAGOGASTRODUODENOSCOPY (EGD) WITH PROPOFOL; N/A     Comment:  Positive for Barrett's Esophagus. REPEAT 03/2021 08/08/2015: ETHMOIDECTOMY; Bilateral     Comment:  Procedure: TOTAL ETHMOIDECTOMY;  Surgeon: Carloyn Manner, MD;  Location: [place];  Service:               ENT;  Laterality: Bilateral; 08/08/2015: FRONTAL SINUS EXPLORATION; Left     Comment:  Procedure: FRONTAL SINUS EXPLORATION;  Surgeon:               Carloyn Manner, MD;  Location: [facility];                Service: ENT;  Laterality: Left; 08/08/2015: IMAGE GUIDED SINUS SURGERY; N/A     Comment:  Procedure: IMAGE GUIDED SINUS SURGERY;  Surgeon:           Carloyn Manner, MD;  Location: [place];                Service: ENT;  Laterality: N/A;  GAVE DISK TO CECE 12/8 08/08/2015: MAXILLARY ANTROSTOMY; Bilateral     Comment:  Procedure: MAXILLARY ANTROSTOMY;  Surgeon: Carloyn Manner, MD;  Location: [place];  Service:               ENT;  Laterality: Bilateral; No date: MICROLARYNGOSCOPY     Comment:  with biopsy 08/08/2015: NASAL TURBINATE REDUCTION; Bilateral     Comment:  Procedure: TURBINATE REDUCTION/SUBMUCOSAL RESECTION;                Surgeon: Carloyn Manner, MD;  Location: [place];  Service: ENT;  Laterality: Bilateral; 08/08/2015: SEPTOPLASTY; N/A     Comment:  Procedure: SEPTOPLASTY;  Surgeon: Carloyn Manner, MD;               Location: [place];  Service: ENT;  Laterality: N/A; 08/08/2015: SPHENOIDECTOMY; Left     Comment:  Procedure: SPHENOIDECTOMY;  Surgeon: Carloyn Manner,               MD;  Location: [place];  Service: ENT;                Laterality: Left; 2013: UPPER GASTROINTESTINAL ENDOSCOPY No date: WISDOM TOOTH EXTRACTION   Reproductive/Obstetrics                             Anesthesia Physical Anesthesia Plan  ASA: II  Anesthesia Plan: General ETT   Post-op Pain Management:    Induction: Intravenous  PONV Risk Score and Plan: 2 and Ondansetron, Treatment may vary due to age or medical condition, Midazolam and Dexamethasone  Airway Management Planned: Oral ETT  Additional Equipment:   Intra-op Plan:   Post-operative Plan: Extubation in OR  Informed Consent: I have reviewed the patients History and Physical, chart, labs and discussed the procedure including the risks, benefits and alternatives for the proposed anesthesia with the patient or authorized representative who has indicated his/her understanding and acceptance.   Dental Advisory Given  Plan Discussed with:  CRNA  Anesthesia Plan Comments:         Anesthesia Quick Evaluation

## 2018-07-21 NOTE — Transfer of Care (Signed)
Immediate Anesthesia Transfer of Care Note  Patient: David Kennedy  Procedure(s) Performed: ANTERIOR CERVICAL DECOMPRESSION/DISCECTOMY FUSION 3 LEVELS-C4-7 (N/A Neck)  Patient Location: PACU  Anesthesia Type:General  Level of Consciousness: awake and responds to stimulation  Airway & Oxygen Therapy: Patient Spontanous Breathing and Patient connected to face mask oxygen  Post-op Assessment: Report given to RN and Post -op Vital signs reviewed and stable  Post vital signs: Reviewed and stable  Last Vitals:  Vitals Value Taken Time  BP 150/97 07/21/2018  5:03 PM  Temp    Pulse 53 07/21/2018  5:03 PM  Resp 12 07/21/2018  5:03 PM  SpO2 98 % 07/21/2018  5:03 PM    Last Pain:  Vitals:   07/21/18 1657  TempSrc:   PainSc: (P) 0-No pain      Patients Stated Pain Goal: 3 (29/92/42 [id number])  Complications: No apparent anesthesia complications

## 2018-07-22 ENCOUNTER — Encounter: Payer: Self-pay | Admitting: Neurosurgery

## 2018-07-22 ENCOUNTER — Observation Stay: Payer: BLUE CROSS/BLUE SHIELD

## 2018-07-22 DIAGNOSIS — M4322 Fusion of spine, cervical region: Secondary | ICD-10-CM | POA: Diagnosis not present

## 2018-07-22 DIAGNOSIS — G960 Cerebrospinal fluid leak: Secondary | ICD-10-CM | POA: Diagnosis not present

## 2018-07-22 DIAGNOSIS — F17200 Nicotine dependence, unspecified, uncomplicated: Secondary | ICD-10-CM | POA: Diagnosis not present

## 2018-07-22 DIAGNOSIS — M4302 Spondylolysis, cervical region: Secondary | ICD-10-CM | POA: Diagnosis not present

## 2018-07-22 DIAGNOSIS — M5412 Radiculopathy, cervical region: Secondary | ICD-10-CM | POA: Diagnosis not present

## 2018-07-22 MED ORDER — METHOCARBAMOL 500 MG PO TABS: 500.0000 mg | ORAL_TABLET | Freq: Four times a day (QID) | ORAL | 0 refills | Status: DC | PRN

## 2018-07-22 MED ORDER — CELECOXIB 100 MG PO CAPS: 100.0000 mg | ORAL_CAPSULE | Freq: Two times a day (BID) | ORAL | 0 refills | Status: DC

## 2018-07-22 NOTE — Discharge Instructions (Signed)

## 2018-07-22 NOTE — Care Management (Signed)
RNCM met with patient and his significant other. He plans to return to outpatient PT at [facility]. [facility] home health is not in network with insurance.  No RNCM needs.

## 2018-07-22 NOTE — Discharge Summary (Signed)
Procedure: C4-C7 ACDF Procedure date 07/21/2018 Diagnosis: Cervical radiculopathy  History: POD1:  Update: Patient has eaten and ambulated without issue.   Patient is recovering well although he does complain of mild worsened weakness throughout his right arm following surgery.  Also complains of  tingling in the right arm and numbness in the third and fourth digit of right hand, but this is intermittent and not very bothersome.  Complains of "sore throat" but no issues tolerating liquid at this time.  He has not eaten following procedure, awaiting breakfast.  He has not yet ambulated, but denies any lower extremity symptoms such as pain/numbness/tingling/weakness.  Pain currently rated 6/10 primarily in the posterior neck and clavicular areas.  Otherwise, pain that he was feeling prior to surgery has resolved.   POD0 [name] is EOD 0 status post C4-C7 ACDF for cervical radiculopathy.  Weighted and postop recovery still disoriented from anesthesia but able to answer questions and obey commands. Complains of posterior neck pain and numbness. No pain, numbness, tingling in upper extremities.   Physical Exam: Vitals:   07/22/18 0421 07/22/18 0827  BP: (!) 143/86 (!) 144/83  Pulse: 71 66  Resp: 20   Temp: (!) 97.5 F (36.4 C) 97.7 F (36.5 C)  SpO2: 94% 96%    AA Ox3 Neck: Incision site intact.  Mild tenderness to palpation.  Minimal edema.  Cervical collar in place.  No drainage. Strength: 5/5 lower extremities and left upper extremity.  4- to 4/5 right bicep, tricep, deltoid, wrist extension, wrist flexion. Sensation: Intact and symmetric upper and lower  Data:  No results for input(s): NA, K, CL, CO2, BUN, CREATININE, LABGLOM, GLUCOSE, CALCIUM in the last 168 hours. No results for input(s): AST, ALT, ALKPHOS in the last 168 hours.  Invalid input(s): TBILI   No results for input(s): WBC, HGB, HCT, PLT in the last 168 hours. No results for input(s): APTT, INR in the last 168  hours.       Other tests/results:  DG cervical spine results pending.  Assessment/Plan:  David Kennedy is POD 1 status post C4-C7 ACDF for cervical radiculopathy.  He is recovering well but does complain of mildly worsening right upper extremity weakness but denies any pain in upper extremities. Eaten, ambulated, and voided without issue. Does not tolerate pain medication well. Will continue pain control with celebrex, tylenol, and robaxin as needed. He is scheduled to follow up in clinic in approximately 2 weeks to monitor progress. Advised to attempt low impact muscle strengthening in right extremity. Will determine if PT should be considered after follow up.   Marin Olp PA-C Department of Neurosurgery

## 2018-07-22 NOTE — Progress Notes (Signed)
Procedure: C4-C7 ACDF Procedure date 07/21/2018 Diagnosis: Cervical radiculopathy  History: POD1: Patient is recovering well although he does complain of mild worsened weakness throughout his right arm following surgery.  Also complains of  tingling in the right arm and numbness in the third and fourth digit of right hand, but this is intermittent and not very bothersome.  Complains of "sore throat" but no issues tolerating liquid at this time.  He has not eaten following procedure, awaiting breakfast.  He has not yet ambulated, but denies any lower extremity symptoms such as pain/numbness/tingling/weakness.  Pain currently rated 6/10 primarily in the posterior neck and clavicular areas.  Otherwise, pain that he was feeling prior to surgery has resolved.   POD0 David Kennedy is EOD 0 status post C4-C7 ACDF for cervical radiculopathy.  Weighted and postop recovery still disoriented from anesthesia but able to answer questions and obey commands. Complains of posterior neck pain and numbness. No pain, numbness, tingling in upper extremities.   Physical Exam: Vitals:   07/22/18 0421 07/22/18 0827  BP: (!) 143/86 (!) 144/83  Pulse: 71 66  Resp: 20   Temp: (!) 97.5 F (36.4 C) 97.7 F (36.5 C)  SpO2: 94% 96%    AA Ox3 Neck: Incision site intact.  Mild tenderness to palpation.  Minimal edema.  Cervical collar in place.  No drainage. Strength: 5/5 lower extremities and left upper extremity.  4- to 4/5 right bicep, tricep, deltoid, wrist extension, wrist flexion. Sensation: Intact and symmetric upper and lower  Data:  No results for input(s): NA, K, CL, CO2, BUN, CREATININE, LABGLOM, GLUCOSE, CALCIUM in the last 168 hours. No results for input(s): AST, ALT, ALKPHOS in the last 168 hours.  Invalid input(s): TBILI   No results for input(s): WBC, HGB, HCT, PLT in the last 168 hours. No results for input(s): APTT, INR in the last 168 hours.       Other tests/results:  DG cervical spine  results pending.  Assessment/Plan:  David Kennedy is POD 1 status post C4-C7 ACDF for cervical radiculopathy.  He is recovering well but does complain of mildly worsening right upper extremity weakness.  We will continue pain control with Tylenol, Robaxin, oxycodone as needed.   - Home PT - Eat - DC position orders - mobilize - pain control - DVT prophylaxis  Marin Olp PA-C Department of Neurosurgery

## 2018-07-22 NOTE — Progress Notes (Signed)
DISCHARGE NOTE:  Pt given discharge instructions. Pt verbalized understanding. Aspen brace in place. Pt wheeled to car by staff.

## 2018-07-23 ENCOUNTER — Encounter: Payer: Self-pay | Admitting: Internal Medicine

## 2018-07-23 NOTE — Anesthesia Postprocedure Evaluation (Signed)
Anesthesia Post Note  Patient: David Kennedy  Procedure(s) Performed: ANTERIOR CERVICAL DECOMPRESSION/DISCECTOMY FUSION 3 LEVELS-C4-7 (N/A Neck)  Patient location during evaluation: PACU Anesthesia Type: General Level of consciousness: awake and alert Pain management: pain level controlled Vital Signs Assessment: post-procedure vital signs reviewed and stable Respiratory status: spontaneous breathing, nonlabored ventilation, respiratory function stable and patient connected to nasal cannula oxygen Cardiovascular status: blood pressure returned to baseline and stable Postop Assessment: no apparent nausea or vomiting Anesthetic complications: no    Some degree of rightward tracheal deviation was noted after surgery (surgeon aware). No obvious hematoma. Pt with no respiratory distress following extubation.  Last Vitals:  Vitals:   07/22/18 0827 07/22/18 1227  BP: (!) 144/83 (!) 153/84  Pulse: 66 62  Resp:  16  Temp: 36.5 C 36.8 C  SpO2: 96% 97%    Last Pain:  Vitals:   07/22/18 1227  TempSrc: Axillary  PainSc:                  Durenda Hurt

## 2018-08-05 ENCOUNTER — Other Ambulatory Visit: Payer: Self-pay | Admitting: Internal Medicine

## 2018-08-05 DIAGNOSIS — F411 Generalized anxiety disorder: Secondary | ICD-10-CM

## 2018-08-13 DIAGNOSIS — M6281 Muscle weakness (generalized): Secondary | ICD-10-CM | POA: Diagnosis not present

## 2018-08-13 DIAGNOSIS — M542 Cervicalgia: Secondary | ICD-10-CM | POA: Diagnosis not present

## 2018-08-13 DIAGNOSIS — R29898 Other symptoms and signs involving the musculoskeletal system: Secondary | ICD-10-CM | POA: Diagnosis not present

## 2018-08-13 DIAGNOSIS — Z981 Arthrodesis status: Secondary | ICD-10-CM | POA: Diagnosis not present

## 2018-08-17 DIAGNOSIS — M6281 Muscle weakness (generalized): Secondary | ICD-10-CM | POA: Diagnosis not present

## 2018-08-17 DIAGNOSIS — Z981 Arthrodesis status: Secondary | ICD-10-CM | POA: Diagnosis not present

## 2018-08-17 DIAGNOSIS — R29898 Other symptoms and signs involving the musculoskeletal system: Secondary | ICD-10-CM | POA: Diagnosis not present

## 2018-08-17 DIAGNOSIS — M542 Cervicalgia: Secondary | ICD-10-CM | POA: Diagnosis not present

## 2018-08-20 DIAGNOSIS — M6281 Muscle weakness (generalized): Secondary | ICD-10-CM | POA: Diagnosis not present

## 2018-08-20 DIAGNOSIS — R29898 Other symptoms and signs involving the musculoskeletal system: Secondary | ICD-10-CM | POA: Diagnosis not present

## 2018-08-20 DIAGNOSIS — Z981 Arthrodesis status: Secondary | ICD-10-CM | POA: Diagnosis not present

## 2018-08-20 DIAGNOSIS — M542 Cervicalgia: Secondary | ICD-10-CM | POA: Diagnosis not present

## 2018-08-23 DIAGNOSIS — M6281 Muscle weakness (generalized): Secondary | ICD-10-CM | POA: Diagnosis not present

## 2018-08-23 DIAGNOSIS — Z981 Arthrodesis status: Secondary | ICD-10-CM | POA: Diagnosis not present

## 2018-08-23 DIAGNOSIS — R29898 Other symptoms and signs involving the musculoskeletal system: Secondary | ICD-10-CM | POA: Diagnosis not present

## 2018-08-23 DIAGNOSIS — M542 Cervicalgia: Secondary | ICD-10-CM | POA: Diagnosis not present

## 2018-08-24 ENCOUNTER — Other Ambulatory Visit: Payer: Self-pay | Admitting: Internal Medicine

## 2018-08-24 DIAGNOSIS — K2100 Gastro-esophageal reflux disease with esophagitis, without bleeding: Secondary | ICD-10-CM

## 2018-08-25 DIAGNOSIS — R29898 Other symptoms and signs involving the musculoskeletal system: Secondary | ICD-10-CM | POA: Diagnosis not present

## 2018-08-25 DIAGNOSIS — M6281 Muscle weakness (generalized): Secondary | ICD-10-CM | POA: Diagnosis not present

## 2018-08-25 DIAGNOSIS — Z981 Arthrodesis status: Secondary | ICD-10-CM | POA: Diagnosis not present

## 2018-08-25 DIAGNOSIS — M542 Cervicalgia: Secondary | ICD-10-CM | POA: Diagnosis not present

## 2018-08-26 DIAGNOSIS — Z981 Arthrodesis status: Secondary | ICD-10-CM | POA: Diagnosis not present

## 2018-08-26 DIAGNOSIS — M5412 Radiculopathy, cervical region: Secondary | ICD-10-CM | POA: Diagnosis not present

## 2018-08-26 DIAGNOSIS — M4322 Fusion of spine, cervical region: Secondary | ICD-10-CM | POA: Diagnosis not present

## 2018-09-01 DIAGNOSIS — M542 Cervicalgia: Secondary | ICD-10-CM | POA: Diagnosis not present

## 2018-09-01 DIAGNOSIS — M6281 Muscle weakness (generalized): Secondary | ICD-10-CM | POA: Diagnosis not present

## 2018-09-01 DIAGNOSIS — R29898 Other symptoms and signs involving the musculoskeletal system: Secondary | ICD-10-CM | POA: Diagnosis not present

## 2018-09-01 DIAGNOSIS — Z981 Arthrodesis status: Secondary | ICD-10-CM | POA: Diagnosis not present

## 2018-09-08 DIAGNOSIS — M542 Cervicalgia: Secondary | ICD-10-CM | POA: Diagnosis not present

## 2018-09-08 DIAGNOSIS — R29898 Other symptoms and signs involving the musculoskeletal system: Secondary | ICD-10-CM | POA: Diagnosis not present

## 2018-09-08 DIAGNOSIS — Z981 Arthrodesis status: Secondary | ICD-10-CM | POA: Diagnosis not present

## 2018-09-08 DIAGNOSIS — M6281 Muscle weakness (generalized): Secondary | ICD-10-CM | POA: Diagnosis not present

## 2018-09-14 DIAGNOSIS — M6281 Muscle weakness (generalized): Secondary | ICD-10-CM | POA: Diagnosis not present

## 2018-09-14 DIAGNOSIS — M542 Cervicalgia: Secondary | ICD-10-CM | POA: Diagnosis not present

## 2018-09-14 DIAGNOSIS — R29898 Other symptoms and signs involving the musculoskeletal system: Secondary | ICD-10-CM | POA: Diagnosis not present

## 2018-09-14 DIAGNOSIS — Z981 Arthrodesis status: Secondary | ICD-10-CM | POA: Diagnosis not present

## 2018-09-16 ENCOUNTER — Other Ambulatory Visit: Payer: Self-pay

## 2018-09-16 ENCOUNTER — Encounter: Payer: Self-pay | Admitting: Emergency Medicine

## 2018-09-16 ENCOUNTER — Ambulatory Visit
Admission: EM | Admit: 2018-09-16 | Discharge: 2018-09-16 | Disposition: A | Discharge status: Home / Self Care | Payer: BLUE CROSS/BLUE SHIELD | Attending: Emergency Medicine | Admitting: Emergency Medicine

## 2018-09-16 DIAGNOSIS — J0100 Acute maxillary sinusitis, unspecified: Secondary | ICD-10-CM | POA: Insufficient documentation

## 2018-09-16 DIAGNOSIS — Z720 Tobacco use: Secondary | ICD-10-CM | POA: Diagnosis not present

## 2018-09-16 DIAGNOSIS — Z981 Arthrodesis status: Secondary | ICD-10-CM | POA: Diagnosis not present

## 2018-09-16 DIAGNOSIS — M6281 Muscle weakness (generalized): Secondary | ICD-10-CM | POA: Diagnosis not present

## 2018-09-16 DIAGNOSIS — R29898 Other symptoms and signs involving the musculoskeletal system: Secondary | ICD-10-CM | POA: Diagnosis not present

## 2018-09-16 DIAGNOSIS — M542 Cervicalgia: Secondary | ICD-10-CM | POA: Diagnosis not present

## 2018-09-16 MED ORDER — PREDNISONE 10 MG (21) PO TBPK: ORAL_TABLET | ORAL | 0 refills | Status: DC

## 2018-09-16 MED ORDER — DOXYCYCLINE HYCLATE 100 MG PO CAPS: 100.0000 mg | ORAL_CAPSULE | Freq: Two times a day (BID) | ORAL | 0 refills | Status: AC

## 2018-09-16 NOTE — ED Triage Notes (Signed)
Patient in today c/o sinus congestion/pain and chest congestion x 3-4 days. Patient denies fever. Patient has OTC Sudafed sinus and sinus rinse. Patient has a history of sinus infections and had sinus surgery in 2016.

## 2018-09-16 NOTE — ED Provider Notes (Signed)
HPI  SUBJECTIVE:  David Kennedy is a 53 y.o. male who presents with green nasal congestion, rhinorrhea, postnasal drip, sinus pain pressure, watery eyes, decreased hearing for the past 3 or 4 days.  He states that his upper teeth hurt.  He denies ear pain, otorrhea, fevers, facial swelling, allergy symptoms.  He tried Sudafed, saline nasal irrigation, Flonase, Tylenol.  The Sudafed and saline nasal irrigation helped.  No aggravating factors.  No antibiotics in the past month, antipyretic in the past 4 to 6 hours.  He has a past medical history of frequent sinusitis and is status post sinus surgery in 2016.  He is status post neck surgery in December and states that he is unable to take ibuprofen.  He is a smoker.  No history of diabetes, hypertension, asthma, emphysema, COPD.  PNT:[id number], Jesse Sans, MD   Past Medical History:  Diagnosis Date  . Anxiety   . Arthritis    osteo - hips  . Bone spur   . GERD (gastroesophageal reflux disease)   . Herniated intervertebral disc of lumbar spine   . Sinusitis   . Wears dentures    partial upper and lower    Past Surgical History:  Procedure Laterality Date  . ANTERIOR CERVICAL DECOMP/DISCECTOMY FUSION N/A 07/21/2018   Procedure: ANTERIOR CERVICAL DECOMPRESSION/DISCECTOMY FUSION 3 LEVELS-C4-7;  Surgeon: Meade Maw, MD;  Location: [facility] ORS;  Service: Neurosurgery;  Laterality: N/A;  . COLONOSCOPY WITH PROPOFOL N/A 02/26/2018   Procedure: COLONOSCOPY WITH PROPOFOL;  Surgeon: [name], MD;  Location: [facility] ENDOSCOPY;  Service: Gastroenterology;  Laterality: N/A;  . ESOPHAGOGASTRODUODENOSCOPY (EGD) WITH PROPOFOL N/A 02/26/2018   Positive for Barrett's Esophagus. REPEAT 03/2021  . ETHMOIDECTOMY Bilateral 08/08/2015   Procedure: TOTAL ETHMOIDECTOMY;  Surgeon: Carloyn Manner, MD;  Location: [place];  Service: ENT;  Laterality: Bilateral;  . FRONTAL SINUS EXPLORATION Left 08/08/2015   Procedure: FRONTAL SINUS EXPLORATION;   Surgeon: Carloyn Manner, MD;  Location: [place];  Service: ENT;  Laterality: Left;  . IMAGE GUIDED SINUS SURGERY N/A 08/08/2015   Procedure: IMAGE GUIDED SINUS SURGERY;  Surgeon: Carloyn Manner, MD;  Location: [place];  Service: ENT;  Laterality: N/A;  GAVE DISK TO CE[facility] 12/8  . MAXILLARY ANTROSTOMY Bilateral 08/08/2015   Procedure: MAXILLARY ANTROSTOMY;  Surgeon: Carloyn Manner, MD;  Location: [place];  Service: ENT;  Laterality: Bilateral;  . MICROLARYNGOSCOPY     with biopsy  . NASAL TURBINATE REDUCTION Bilateral 08/08/2015   Procedure: TURBINATE REDUCTION/SUBMUCOSAL RESECTION;  Surgeon: Carloyn Manner, MD;  Location: [place];  Service: ENT;  Laterality: Bilateral;  . SEPTOPLASTY N/A 08/08/2015   Procedure: SEPTOPLASTY;  Surgeon: Carloyn Manner, MD;  Location: [place];  Service: ENT;  Laterality: N/A;  . SPHENOIDECTOMY Left 08/08/2015   Procedure: Coralee Pesa;  Surgeon: Carloyn Manner, MD;  Location: [place];  Service: ENT;  Laterality: Left;  . UPPER GASTROINTESTINAL ENDOSCOPY  2013  . WISDOM TOOTH EXTRACTION      Family History  Problem Relation Age of Onset  . Diabetes Mother   . Arthritis Mother   . Heart disease Father 7  . Arthritis Father   . Hyperlipidemia Father   . Hypertension Father   . Pancreatic cancer Brother     Social History   Tobacco Use  . Smoking status: Current Every Day Smoker    Packs/day: 1.00    Years: 30.00    Pack years: 30.00    Types: Cigarettes  . Smokeless tobacco:  Never Used  Substance Use Topics  . Alcohol use: Yes    Comment: 2 beers/month  . Drug use: No    No current facility-administered medications for this encounter.   Current Outpatient Medications:  .  acetaminophen (TYLENOL) 500 MG tablet, Take 1,000 mg by mouth every 6 (six) hours as needed., Disp: , Rfl:  .  Calcipotriene-Betameth Diprop (ENSTILAR EX), Apply 1 application topically daily  as needed (psoriasis)., Disp: , Rfl:  .  DEXILANT 60 MG capsule, TAKE 1 CAPSULE (60 MG TOTAL) BY MOUTH 2 (TWO) TIMES DAILY., Disp: 60 capsule, Rfl: 5 .  fluticasone (FLONASE) 50 MCG/ACT nasal spray, Place 2 sprays into both nostrils daily. , Disp: , Rfl: 11 .  methocarbamol (ROBAXIN) 500 MG tablet, Take 1 tablet (500 mg total) by mouth every 6 (six) hours as needed for muscle spasms., Disp: 90 tablet, Rfl: 0 .  Multiple Vitamin (MULTIVITAMIN) tablet, Take 1 tablet by mouth daily., Disp: , Rfl:  .  PARoxetine (PAXIL-CR) 25 MG 24 hr tablet, TAKE 1 TABLET (25 MG TOTAL) BY MOUTH DAILY. AM, Disp: 90 tablet, Rfl: 3 .  doxycycline (VIBRAMYCIN) 100 MG capsule, Take 1 capsule (100 mg total) by mouth 2 (two) times daily for 7 days., Disp: 14 capsule, Rfl: 0 .  predniSONE (STERAPRED UNI-PAK 21 TAB) 10 MG (21) TBPK tablet, Dispense one 6 day pack. Take as directed with food., Disp: 21 tablet, Rfl: 0  Allergies  Allergen Reactions  . Atorvastatin Other (See Comments)    Headache and fatigue   . Ezetimibe Other (See Comments)    Joint pain   . Pravachol [Pravastatin] Other (See Comments)    Pt can not recall SE     ROS  As noted in HPI.   Physical Exam  BP (!) 148/90 (BP Location: Right Arm)   Pulse 69   Temp 98.2 F (36.8 C) (Oral)   Resp 16   Ht 5\' 9"  (1.753 m)   Wt 99.8 kg   SpO2 95%   BMI 32.49 kg/m   Constitutional: Well developed, well nourished, no acute distress Eyes:  EOMI, conjunctiva normal bilaterally HENT: Normocephalic, atraumatic,mucus membranes moist.  Positive purulent nasal congestion.  Normal turbinates.  No frontal sinus tenderness.  Positive bilateral maxillary sinus tenderness.  Positive cobblestoning, normal oropharynx, no obvious postnasal drip. Respiratory: Normal inspiratory effort lungs clear bilaterally, good air movement. Cardiovascular: Normal rate GI: nondistended skin: No rash, skin intact Musculoskeletal: no deformities Neurologic: Alert & oriented x  3, no focal neuro deficits Psychiatric: Speech and behavior appropriate   ED Course   Medications - No data to display  No orders of the defined types were placed in this encounter.   No results found for this or any previous visit (from the past 24 hour(s)). No results found.  ED Clinical Impression  Acute non-recurrent maxillary sinusitis   ED Assessment/Plan  Patient with a sinusitis.  Home with Mucinex D, discontinue Sudafed, continue saline nasal irrigation, Flonase.  Will try a 6-day prednisone taper and if he is not feeling better in 24 hours, doxycycline because he does have a history of sinusitis and has purulent discharge on exam.  Follow-up with PMD as needed.  Discussed MDM, treatment plan, and plan for follow-up with patient.. patient agrees with plan.   Meds ordered this encounter  Medications  . doxycycline (VIBRAMYCIN) 100 MG capsule    Sig: Take 1 capsule (100 mg total) by mouth 2 (two) times daily for 7 days.  Dispense:  14 capsule    Refill:  0  . predniSONE (STERAPRED UNI-PAK 21 TAB) 10 MG (21) TBPK tablet    Sig: Dispense one 6 day pack. Take as directed with food.    Dispense:  21 tablet    Refill:  0    *This clinic note was created using [employer]. Therefore, there may be occasional mistakes despite careful proofreading.   ?    Melynda Ripple, MD 09/17/18 [phone]

## 2018-09-16 NOTE — Discharge Instructions (Addendum)
Mucinex D, discontinue Sudafed, continue saline nasal irrigation, Flonase.  Try the prednisone taper.  If you are not feeling better in 24 hours, go ahead and start the doxycycline, which is an antibiotic.

## 2018-09-21 DIAGNOSIS — M542 Cervicalgia: Secondary | ICD-10-CM | POA: Diagnosis not present

## 2018-09-21 DIAGNOSIS — M6281 Muscle weakness (generalized): Secondary | ICD-10-CM | POA: Diagnosis not present

## 2018-09-21 DIAGNOSIS — Z981 Arthrodesis status: Secondary | ICD-10-CM | POA: Diagnosis not present

## 2018-09-21 DIAGNOSIS — R29898 Other symptoms and signs involving the musculoskeletal system: Secondary | ICD-10-CM | POA: Diagnosis not present

## 2018-09-28 DIAGNOSIS — M6281 Muscle weakness (generalized): Secondary | ICD-10-CM | POA: Diagnosis not present

## 2018-09-28 DIAGNOSIS — Z981 Arthrodesis status: Secondary | ICD-10-CM | POA: Diagnosis not present

## 2018-09-28 DIAGNOSIS — M542 Cervicalgia: Secondary | ICD-10-CM | POA: Diagnosis not present

## 2018-09-28 DIAGNOSIS — R29898 Other symptoms and signs involving the musculoskeletal system: Secondary | ICD-10-CM | POA: Diagnosis not present

## 2018-09-30 DIAGNOSIS — M542 Cervicalgia: Secondary | ICD-10-CM | POA: Diagnosis not present

## 2018-09-30 DIAGNOSIS — Z981 Arthrodesis status: Secondary | ICD-10-CM | POA: Diagnosis not present

## 2018-09-30 DIAGNOSIS — R29898 Other symptoms and signs involving the musculoskeletal system: Secondary | ICD-10-CM | POA: Diagnosis not present

## 2018-09-30 DIAGNOSIS — M6281 Muscle weakness (generalized): Secondary | ICD-10-CM | POA: Diagnosis not present

## 2018-10-05 DIAGNOSIS — M542 Cervicalgia: Secondary | ICD-10-CM | POA: Diagnosis not present

## 2018-10-05 DIAGNOSIS — M6281 Muscle weakness (generalized): Secondary | ICD-10-CM | POA: Diagnosis not present

## 2018-10-05 DIAGNOSIS — Z981 Arthrodesis status: Secondary | ICD-10-CM | POA: Diagnosis not present

## 2018-10-05 DIAGNOSIS — R29898 Other symptoms and signs involving the musculoskeletal system: Secondary | ICD-10-CM | POA: Diagnosis not present

## 2018-10-06 DIAGNOSIS — R05 Cough: Secondary | ICD-10-CM | POA: Diagnosis not present

## 2018-10-06 DIAGNOSIS — J324 Chronic pansinusitis: Secondary | ICD-10-CM | POA: Diagnosis not present

## 2018-10-07 DIAGNOSIS — R29898 Other symptoms and signs involving the musculoskeletal system: Secondary | ICD-10-CM | POA: Diagnosis not present

## 2018-10-07 DIAGNOSIS — M542 Cervicalgia: Secondary | ICD-10-CM | POA: Diagnosis not present

## 2018-10-07 DIAGNOSIS — Z981 Arthrodesis status: Secondary | ICD-10-CM | POA: Diagnosis not present

## 2018-10-07 DIAGNOSIS — M6281 Muscle weakness (generalized): Secondary | ICD-10-CM | POA: Diagnosis not present

## 2018-10-12 DIAGNOSIS — R29898 Other symptoms and signs involving the musculoskeletal system: Secondary | ICD-10-CM | POA: Diagnosis not present

## 2018-10-12 DIAGNOSIS — M542 Cervicalgia: Secondary | ICD-10-CM | POA: Diagnosis not present

## 2018-10-12 DIAGNOSIS — Z981 Arthrodesis status: Secondary | ICD-10-CM | POA: Diagnosis not present

## 2018-10-12 DIAGNOSIS — M6281 Muscle weakness (generalized): Secondary | ICD-10-CM | POA: Diagnosis not present

## 2018-10-19 DIAGNOSIS — M542 Cervicalgia: Secondary | ICD-10-CM | POA: Diagnosis not present

## 2018-10-19 DIAGNOSIS — Z981 Arthrodesis status: Secondary | ICD-10-CM | POA: Diagnosis not present

## 2018-10-19 DIAGNOSIS — R29898 Other symptoms and signs involving the musculoskeletal system: Secondary | ICD-10-CM | POA: Diagnosis not present

## 2018-10-19 DIAGNOSIS — M6281 Muscle weakness (generalized): Secondary | ICD-10-CM | POA: Diagnosis not present

## 2018-10-20 DIAGNOSIS — M5412 Radiculopathy, cervical region: Secondary | ICD-10-CM | POA: Diagnosis not present

## 2018-10-20 DIAGNOSIS — M5032 Other cervical disc degeneration, mid-cervical region, unspecified level: Secondary | ICD-10-CM | POA: Diagnosis not present

## 2018-10-20 DIAGNOSIS — Z981 Arthrodesis status: Secondary | ICD-10-CM | POA: Diagnosis not present

## 2018-10-21 DIAGNOSIS — M6281 Muscle weakness (generalized): Secondary | ICD-10-CM | POA: Diagnosis not present

## 2018-10-21 DIAGNOSIS — Z981 Arthrodesis status: Secondary | ICD-10-CM | POA: Diagnosis not present

## 2018-10-21 DIAGNOSIS — R29898 Other symptoms and signs involving the musculoskeletal system: Secondary | ICD-10-CM | POA: Diagnosis not present

## 2018-10-21 DIAGNOSIS — M542 Cervicalgia: Secondary | ICD-10-CM | POA: Diagnosis not present

## 2018-10-28 DIAGNOSIS — R29898 Other symptoms and signs involving the musculoskeletal system: Secondary | ICD-10-CM | POA: Diagnosis not present

## 2018-10-28 DIAGNOSIS — Z981 Arthrodesis status: Secondary | ICD-10-CM | POA: Diagnosis not present

## 2018-10-28 DIAGNOSIS — M6281 Muscle weakness (generalized): Secondary | ICD-10-CM | POA: Diagnosis not present

## 2018-10-28 DIAGNOSIS — M542 Cervicalgia: Secondary | ICD-10-CM | POA: Diagnosis not present

## 2018-11-03 DIAGNOSIS — R29898 Other symptoms and signs involving the musculoskeletal system: Secondary | ICD-10-CM | POA: Diagnosis not present

## 2018-11-03 DIAGNOSIS — M6281 Muscle weakness (generalized): Secondary | ICD-10-CM | POA: Diagnosis not present

## 2018-11-03 DIAGNOSIS — M542 Cervicalgia: Secondary | ICD-10-CM | POA: Diagnosis not present

## 2018-11-03 DIAGNOSIS — Z981 Arthrodesis status: Secondary | ICD-10-CM | POA: Diagnosis not present

## 2018-11-17 DIAGNOSIS — Z981 Arthrodesis status: Secondary | ICD-10-CM | POA: Diagnosis not present

## 2018-11-17 DIAGNOSIS — R29898 Other symptoms and signs involving the musculoskeletal system: Secondary | ICD-10-CM | POA: Diagnosis not present

## 2018-11-17 DIAGNOSIS — M542 Cervicalgia: Secondary | ICD-10-CM | POA: Diagnosis not present

## 2018-11-17 DIAGNOSIS — M6281 Muscle weakness (generalized): Secondary | ICD-10-CM | POA: Diagnosis not present

## 2018-11-23 ENCOUNTER — Telehealth: Payer: Self-pay

## 2018-11-23 ENCOUNTER — Other Ambulatory Visit: Payer: Self-pay

## 2018-11-23 ENCOUNTER — Ambulatory Visit (INDEPENDENT_AMBULATORY_CARE_PROVIDER_SITE_OTHER): Payer: BLUE CROSS/BLUE SHIELD | Admitting: Internal Medicine

## 2018-11-23 ENCOUNTER — Encounter: Payer: Self-pay | Admitting: Internal Medicine

## 2018-11-23 VITALS — Temp 100.1°F | Ht 69.0 in | Wt 220.0 lb

## 2018-11-23 DIAGNOSIS — R5081 Fever presenting with conditions classified elsewhere: Secondary | ICD-10-CM

## 2018-11-23 DIAGNOSIS — F17210 Nicotine dependence, cigarettes, uncomplicated: Secondary | ICD-10-CM

## 2018-11-23 DIAGNOSIS — R51 Headache: Secondary | ICD-10-CM | POA: Diagnosis not present

## 2018-11-23 DIAGNOSIS — R05 Cough: Secondary | ICD-10-CM | POA: Diagnosis not present

## 2018-11-23 DIAGNOSIS — R062 Wheezing: Secondary | ICD-10-CM

## 2018-11-23 DIAGNOSIS — R69 Illness, unspecified: Secondary | ICD-10-CM

## 2018-11-23 DIAGNOSIS — Z7189 Other specified counseling: Secondary | ICD-10-CM

## 2018-11-23 DIAGNOSIS — J111 Influenza due to unidentified influenza virus with other respiratory manifestations: Secondary | ICD-10-CM

## 2018-11-23 MED ORDER — OSELTAMIVIR PHOSPHATE 75 MG PO CAPS: 75.0000 mg | ORAL_CAPSULE | Freq: Two times a day (BID) | ORAL | 0 refills | Status: DC

## 2018-11-23 MED ORDER — GUAIFENESIN-CODEINE 100-10 MG/5ML PO SYRP: 5.0000 mL | ORAL_SOLUTION | Freq: Three times a day (TID) | ORAL | 0 refills | Status: DC | PRN

## 2018-11-23 NOTE — Telephone Encounter (Signed)
Patient called. He started feeling sick yesterday. Woke up this morning with Fever of 100.2. Took tylenol- it has gone down. He has a cough- no production. And sinus pressure. Michela Pitcher he has been dealing with a sinus infection. No SOB, travel or been around anyone with POS COVID.   Possible flu- did you want to do a VV for tamiflu?  *please advise*

## 2018-11-23 NOTE — Progress Notes (Signed)
Date:  11/23/2018   Name:  David Kennedy   DOB:  [birth year]   MRN:  [id number]  I connected with this patient, David Kennedy, by telephone at the patient's home.  I verified that I am speaking with the correct person using two identifiers. This visit was conducted via telephone due to the Covid-19 outbreak from my office at [facility] in [place], [place]. I discussed the limitations, risks, security and privacy concerns of performing an evaluation and management service by telephone. I also discussed with the patient that there may be a patient responsible charge related to this service. The patient expressed understanding and agreed to proceed.  Chief Complaint: Cough (Dry Cough, Fever of 101.2 staying around the same level. Tried tylenol. Some body aches, headache and SOB. Has not traveled or been around anyone who has COVID that he knows of.)  Influenza  This is a new problem. The current episode started today. Associated symptoms include chills, coughing, a fever (intially 100.2) and headaches. Pertinent negatives include no chest pain or vomiting. He has tried acetaminophen for the symptoms. The treatment provided no relief.   He has been working during the [employer] doing [employer]on[employer]. He works with different crews on different days.  No one at work has been known to have influenza or Covid-19. He did get influenza vaccine this year.  His live-in girlfriend had mild fever yesterday but seems fine today.  Review of Systems  Constitutional: Positive for chills and fever (intially 100.2).  HENT: Negative for trouble swallowing.   Respiratory: Positive for cough and wheezing (chronic issue due to smoking). Negative for chest tightness and shortness of breath.   Cardiovascular: Negative for chest pain and palpitations.  Gastrointestinal: Negative for vomiting.  Skin: Negative for color change and pallor.  Neurological: Positive for headaches.    Patient Active Problem  List   Diagnosis Date Noted  . Cervical radiculopathy 07/21/2018  . Hx of colonic polyps 03/29/2018  . Barrett's esophagus determined by endoscopy 03/01/2018  . Cervical pain (neck) 12/18/2017  . Dermatitis 06/02/2017  . Dyslipidemia 01/27/2017  . Gastroesophageal reflux disease 07/07/2016  . Tobacco use disorder 07/07/2016  . Generalized anxiety disorder 07/07/2016    Allergies  Allergen Reactions  . Atorvastatin Other (See Comments)    Headache and fatigue   . Ezetimibe Other (See Comments)    Joint pain   . Pravachol [Pravastatin] Other (See Comments)    Pt can not recall SE    Past Surgical History:  Procedure Laterality Date  . ANTERIOR CERVICAL DECOMP/DISCECTOMY FUSION N/A 07/21/2018   Procedure: ANTERIOR CERVICAL DECOMPRESSION/DISCECTOMY FUSION 3 LEVELS-C4-7;  Surgeon: Meade Maw, MD;  Location: [facility] ORS;  Service: Neurosurgery;  Laterality: N/A;  . COLONOSCOPY WITH PROPOFOL N/A 02/26/2018   Procedure: COLONOSCOPY WITH PROPOFOL;  Surgeon: [name], MD;  Location: [facility] ENDOSCOPY;  Service: Gastroenterology;  Laterality: N/A;  . ESOPHAGOGASTRODUODENOSCOPY (EGD) WITH PROPOFOL N/A 02/26/2018   Positive for Barrett's Esophagus. REPEAT 03/2021  . ETHMOIDECTOMY Bilateral 08/08/2015   Procedure: TOTAL ETHMOIDECTOMY;  Surgeon: Carloyn Manner, MD;  Location: [place];  Service: ENT;  Laterality: Bilateral;  . FRONTAL SINUS EXPLORATION Left 08/08/2015   Procedure: FRONTAL SINUS EXPLORATION;  Surgeon: Carloyn Manner, MD;  Location: [place];  Service: ENT;  Laterality: Left;  . IMAGE GUIDED SINUS SURGERY N/A 08/08/2015   Procedure: IMAGE GUIDED SINUS SURGERY;  Surgeon: Carloyn Manner, MD;  Location: [place];  Service: ENT;  Laterality: N/A;  GAVE  DISK TO [facility] 12/8  . MAXILLARY ANTROSTOMY Bilateral 08/08/2015   Procedure: MAXILLARY ANTROSTOMY;  Surgeon: Carloyn Manner, MD;  Location: [place];  Service: ENT;  Laterality:  Bilateral;  . MICROLARYNGOSCOPY     with biopsy  . NASAL TURBINATE REDUCTION Bilateral 08/08/2015   Procedure: TURBINATE REDUCTION/SUBMUCOSAL RESECTION;  Surgeon: Carloyn Manner, MD;  Location: [place];  Service: ENT;  Laterality: Bilateral;  . SEPTOPLASTY N/A 08/08/2015   Procedure: SEPTOPLASTY;  Surgeon: Carloyn Manner, MD;  Location: [place];  Service: ENT;  Laterality: N/A;  . SPHENOIDECTOMY Left 08/08/2015   Procedure: Coralee Pesa;  Surgeon: Carloyn Manner, MD;  Location: [place];  Service: ENT;  Laterality: Left;  . UPPER GASTROINTESTINAL ENDOSCOPY  2013  . WISDOM TOOTH EXTRACTION      Social History   Tobacco Use  . Smoking status: Current Every Day Smoker    Packs/day: 1.00    Years: 30.00    Pack years: 30.00    Types: Cigarettes  . Smokeless tobacco: Never Used  Substance Use Topics  . Alcohol use: Yes    Comment: 2 beers/month  . Drug use: No     Medication list has been reviewed and updated.  Current Meds  Medication Sig  . acetaminophen (TYLENOL) 500 MG tablet Take 1,000 mg by mouth every 6 (six) hours as needed.  . Calcipotriene-Betameth Diprop (ENSTILAR EX) Apply 1 application topically daily as needed (psoriasis).  . DEXILANT 60 MG capsule TAKE 1 CAPSULE (60 MG TOTAL) BY MOUTH 2 (TWO) TIMES DAILY.  . fluticasone (FLONASE) 50 MCG/ACT nasal spray Place 2 sprays into both nostrils daily.   . methocarbamol (ROBAXIN) 500 MG tablet Take 1 tablet (500 mg total) by mouth every 6 (six) hours as needed for muscle spasms.  . Multiple Vitamin (MULTIVITAMIN) tablet Take 1 tablet by mouth daily.  Marland Kitchen PARoxetine (PAXIL-CR) 25 MG 24 hr tablet TAKE 1 TABLET (25 MG TOTAL) BY MOUTH DAILY. AM    PHQ 2/9 Scores 11/23/2018 12/18/2017 06/02/2017  PHQ - 2 Score 0 0 0    BP Readings from Last 3 Encounters:  09/16/18 (!) 148/90  07/22/18 (!) 153/84  07/09/18 (!) 140/98    Physical Exam Chest:     Comments: Dry cough heard with mild  wheezes during the call.  No apparent dyspnea. Neurological:     Mental Status: He is alert.  Psychiatric:        Attention and Perception: Attention normal.        Mood and Affect: Mood normal.        Cognition and Memory: Cognition normal.     Wt Readings from Last 3 Encounters:  11/23/18 220 lb (99.8 kg)  09/16/18 220 lb (99.8 kg)  07/09/18 220 lb (99.8 kg)    Temp 100.1 F (37.8 C) (Oral) Comment: Patient Reported from Home  Ht 5\' 9"  (1.753 m)   Wt 220 lb (99.8 kg)   BMI 32.49 kg/m   Assessment and Plan: 1. Influenza-like illness Pt advised to assume he has Covid-19 but will treat with Tamiflu and cough syrup Girl friend should quarantine for 14 days as well Advised on parameters for return to work (7 days, sx improved and no fever x 3 d) and parameters for ED visit (cyanosis, extreme SOB, n/v, confusion) - oseltamivir (TAMIFLU) 75 MG capsule; Take 1 capsule (75 mg total) by mouth 2 (two) times daily.  Dispense: 10 capsule; Refill: 0 - guaiFENesin-codeine (ROBITUSSIN AC) 100-10 MG/5ML syrup; Take 5 mLs  by mouth 3 (three) times daily as needed for cough.  Dispense: 118 mL; Refill: 0  2. Advice Given About Covid-19 Virus by Telephone Letter written and placed in chart for pt to access via Mychart   I spent 12 minutes on this encounter. Partially dictated using Editor, commissioning. Any errors are unintentional.  Halina Maidens, MD [facility]  11/23/2018

## 2018-11-23 NOTE — Telephone Encounter (Signed)
Scheduled for virtual visit   

## 2018-11-23 NOTE — Telephone Encounter (Signed)
I can do an encounter,  We have to assume that he has covid-19 so the management is to stay home on quarantine until there is no fever without tylenol for 3 days AND sx are improved AND it has been 7 days since sx started.

## 2018-11-23 NOTE — Patient Instructions (Signed)
This information is directly available on the CDC website: https://www.cdc.gov/coronavirus/2019-ncov/if-you-are-sick/steps-when-sick.html    Source:CDC Reference to specific commercial products, manufacturers, companies, or trademarks does not constitute its endorsement or recommendation by the U.S. Government, Department of Health and Human Services, or Centers for Disease Control and Prevention.  

## 2018-12-01 DIAGNOSIS — M542 Cervicalgia: Secondary | ICD-10-CM | POA: Diagnosis not present

## 2018-12-01 DIAGNOSIS — R29898 Other symptoms and signs involving the musculoskeletal system: Secondary | ICD-10-CM | POA: Diagnosis not present

## 2018-12-01 DIAGNOSIS — Z981 Arthrodesis status: Secondary | ICD-10-CM | POA: Diagnosis not present

## 2018-12-01 DIAGNOSIS — M6281 Muscle weakness (generalized): Secondary | ICD-10-CM | POA: Diagnosis not present

## 2018-12-08 DIAGNOSIS — M6281 Muscle weakness (generalized): Secondary | ICD-10-CM | POA: Diagnosis not present

## 2018-12-08 DIAGNOSIS — Z981 Arthrodesis status: Secondary | ICD-10-CM | POA: Diagnosis not present

## 2018-12-08 DIAGNOSIS — M542 Cervicalgia: Secondary | ICD-10-CM | POA: Diagnosis not present

## 2018-12-08 DIAGNOSIS — R29898 Other symptoms and signs involving the musculoskeletal system: Secondary | ICD-10-CM | POA: Diagnosis not present

## 2018-12-21 ENCOUNTER — Other Ambulatory Visit: Payer: Self-pay

## 2018-12-21 ENCOUNTER — Encounter: Payer: Self-pay | Admitting: Internal Medicine

## 2018-12-21 ENCOUNTER — Ambulatory Visit (INDEPENDENT_AMBULATORY_CARE_PROVIDER_SITE_OTHER): Payer: BLUE CROSS/BLUE SHIELD | Admitting: Internal Medicine

## 2018-12-21 VITALS — BP 108/70 | HR 70 | Temp 98.7°F | Ht 69.0 in | Wt 238.0 lb

## 2018-12-21 DIAGNOSIS — F17200 Nicotine dependence, unspecified, uncomplicated: Secondary | ICD-10-CM | POA: Diagnosis not present

## 2018-12-21 DIAGNOSIS — Z125 Encounter for screening for malignant neoplasm of prostate: Secondary | ICD-10-CM | POA: Diagnosis not present

## 2018-12-21 DIAGNOSIS — Z0000 Encounter for general adult medical examination without abnormal findings: Secondary | ICD-10-CM

## 2018-12-21 DIAGNOSIS — K2100 Gastro-esophageal reflux disease with esophagitis, without bleeding: Secondary | ICD-10-CM

## 2018-12-21 DIAGNOSIS — F411 Generalized anxiety disorder: Secondary | ICD-10-CM | POA: Diagnosis not present

## 2018-12-21 LAB — POCT URINALYSIS DIPSTICK
Bilirubin, UA: NEGATIVE
Blood, UA: NEGATIVE
Glucose, UA: NEGATIVE
Ketones, UA: NEGATIVE
Leukocytes, UA: NEGATIVE
Nitrite, UA: NEGATIVE
Protein, UA: NEGATIVE
Spec Grav, UA: <=1.005 — AB (ref 1.010–1.025)
Urobilinogen, UA: 0.2 E.U./dL
pH, UA: 5 (ref 5.0–8.0)

## 2018-12-21 NOTE — Patient Instructions (Signed)
Steps to Quit Smoking  Smoking tobacco can be harmful to your health and can affect almost every organ in your body. Smoking puts you, and those around you, at risk for developing many serious chronic diseases. Quitting smoking is difficult, but it is one of the best things that you can do for your health. It is never too late to quit. What are the benefits of quitting smoking? When you quit smoking, you lower your risk of developing serious diseases and conditions, such as:  Lung cancer or lung disease, such as COPD.  Heart disease.  Stroke.  Heart attack.  Infertility.  Osteoporosis and bone fractures. Additionally, symptoms such as coughing, wheezing, and shortness of breath may get better when you quit. You may also find that you get sick less often because your body is stronger at fighting off colds and infections. If you are pregnant, quitting smoking can help to reduce your chances of having a baby of low birth weight. How do I get ready to quit? When you decide to quit smoking, create a plan to make sure that you are successful. Before you quit:  Pick a date to quit. Set a date within the next two weeks to give you time to prepare.  Write down the reasons why you are quitting. Keep this list in places where you will see it often, such as on your bathroom mirror or in your car or wallet.  Identify the people, places, things, and activities that make you want to smoke (triggers) and avoid them. Make sure to take these actions: ? Throw away all cigarettes at home, at work, and in your car. ? Throw away smoking accessories, such as ashtrays and lighters. ? Clean your car and make sure to empty the ashtray. ? Clean your home, including curtains and carpets.  Tell your family, friends, and coworkers that you are quitting. Support from your loved ones can make quitting easier.  Talk with your health care provider about your options for quitting smoking.  Find out what treatment  options are covered by your health insurance. What strategies can I use to quit smoking? Talk with your healthcare provider about different strategies to quit smoking. Some strategies include:  Quitting smoking altogether instead of gradually lessening how much you smoke over a period of time. Research shows that quitting "cold turkey" is more successful than gradually quitting.  Attending in-person counseling to help you build problem-solving skills. You are more likely to have success in quitting if you attend several counseling sessions. Even short sessions of 10 minutes can be effective.  Finding resources and support systems that can help you to quit smoking and remain smoke-free after you quit. These resources are most helpful when you use them often. They can include: ? Online chats with a counselor. ? Telephone quitlines. ? Printed self-help materials. ? Support groups or group counseling. ? Text messaging programs. ? Mobile phone applications.  Taking medicines to help you quit smoking. (If you are pregnant or breastfeeding, talk with your health care provider first.) Some medicines contain nicotine and some do not. Both types of medicines help with cravings, but the medicines that include nicotine help to relieve withdrawal symptoms. Your health care provider may recommend: ? Nicotine patches, gum, or lozenges. ? Nicotine inhalers or sprays. ? Non-nicotine medicine that is taken by mouth. Talk with your health care provider about combining strategies, such as taking medicines while you are also receiving in-person counseling. Using these two strategies together makes you   more likely to succeed in quitting than if you used either strategy on its own. If you are pregnant or breastfeeding, talk with your health care provider about finding counseling or other support strategies to quit smoking. Do not take medicine to help you quit smoking unless told to do so by your health care  provider. What things can I do to make it easier to quit? Quitting smoking might feel overwhelming at first, but there is a lot that you can do to make it easier. Take these important actions:  Reach out to your family and friends and ask that they support and encourage you during this time. Call telephone quitlines, reach out to support groups, or work with a counselor for support.  Ask people who smoke to avoid smoking around you.  Avoid places that trigger you to smoke, such as bars, parties, or smoke-break areas at work.  Spend time around people who do not smoke.  Lessen stress in your life, because stress can be a smoking trigger for some people. To lessen stress, try: ? Exercising regularly. ? Deep-breathing exercises. ? Yoga. ? Meditating. ? Performing a body scan. This involves closing your eyes, scanning your body from head to toe, and noticing which parts of your body are particularly tense. Purposefully relax the muscles in those areas.  Download or purchase mobile phone or tablet apps (applications) that can help you stick to your quit plan by providing reminders, tips, and encouragement. There are many free apps, such as QuitGuide from the CDC (Centers for Disease Control and Prevention). You can find other support for quitting smoking (smoking cessation) through smokefree.gov and other websites. How will I feel when I quit smoking? Within the first 24 hours of quitting smoking, you may start to feel some withdrawal symptoms. These symptoms are usually most noticeable 2-3 days after quitting, but they usually do not last beyond 2-3 weeks. Changes or symptoms that you might experience include:  Mood swings.  Restlessness, anxiety, or irritation.  Difficulty concentrating.  Dizziness.  Strong cravings for sugary foods in addition to nicotine.  Mild weight gain.  Constipation.  Nausea.  Coughing or a sore throat.  Changes in how your medicines work in your  body.  A depressed mood.  Difficulty sleeping (insomnia). After the first 2-3 weeks of quitting, you may start to notice more positive results, such as:  Improved sense of smell and taste.  Decreased coughing and sore throat.  Slower heart rate.  Lower blood pressure.  Clearer skin.  The ability to breathe more easily.  Fewer sick days. Quitting smoking is very challenging for most people. Do not get discouraged if you are not successful the first time. Some people need to make many attempts to quit before they achieve long-term success. Do your best to stick to your quit plan, and talk with your health care provider if you have any questions or concerns. This information is not intended to replace advice given to you by your health care provider. Make sure you discuss any questions you have with your health care provider. Document Released: 07/29/2001 Document Revised: 03/10/2017 Document Reviewed: 12/19/2014 Elsevier Interactive Patient Education  2019 [employer]  

## 2018-12-21 NOTE — Progress Notes (Signed)
Date:  12/21/2018   Name:  David Kennedy   DOB:  Sep 02, 1965   MRN:  [id number]   Chief Complaint: Annual Exam David Kennedy is a 53 y.o. male who presents today for his Complete Annual Exam. He feels fairly well. He reports exercising with PTx. He reports he is sleeping fairly well.  He is recovering from [facility] 07/2018 but as residual right arm C5 palsy. He has a chronic sinus infection that is being treated by ENT with antibiotic nasal rinses. He is not interested in Shingles vaccine Colonoscopy 02/2018 TDap 2019  Gastroesophageal Reflux  He complains of heartburn. He reports no abdominal pain, no chest pain, no choking or no wheezing. This is a recurrent problem. The problem occurs occasionally. Pertinent negatives include no fatigue. He has tried a PPI for the symptoms. The treatment provided significant relief.  Anxiety  Presents for follow-up visit. Patient reports no chest pain, decreased concentration, dizziness, excessive worry, insomnia, irritability, nervous/anxious behavior, palpitations, shortness of breath or suicidal ideas. Symptoms occur occasionally.    Nicotine Dependence  Presents for follow-up visit. Symptoms are negative for decreased concentration, fatigue, insomnia and irritability. His urge triggers include company of smokers. The symptoms have been stable. He smokes 1 pack of cigarettes per day. Compliance with prior treatments: has not tolerated several agents to help him quit.    Review of Systems  Constitutional: Negative for appetite change, chills, diaphoresis, fatigue, irritability and unexpected weight change.  HENT: Negative for hearing loss, tinnitus, trouble swallowing and voice change.   Eyes: Negative for visual disturbance.  Respiratory: Negative for choking, shortness of breath and wheezing.   Cardiovascular: Negative for chest pain, palpitations and leg swelling.  Gastrointestinal: Positive for heartburn. Negative for abdominal pain, blood in  stool, constipation and diarrhea.  Genitourinary: Negative for difficulty urinating, dysuria, frequency and hematuria.  Musculoskeletal: Positive for arthralgias (decreased ROM right arm). Negative for back pain and myalgias.  Skin: Negative for color change and rash.  Neurological: Negative for dizziness, syncope and headaches.  Hematological: Negative for adenopathy.  Psychiatric/Behavioral: Negative for decreased concentration, dysphoric mood, sleep disturbance and suicidal ideas. The patient is not nervous/anxious and does not have insomnia.     Patient Active Problem List   Diagnosis Date Noted   Hx of colonic polyps 03/29/2018   Barrett's esophagus determined by endoscopy 03/01/2018   S/P cervical spinal fusion 12/18/2017   Dermatitis 06/02/2017   Dyslipidemia 01/27/2017   Gastroesophageal reflux disease 07/07/2016   Tobacco use disorder 07/07/2016   Generalized anxiety disorder 07/07/2016    Allergies  Allergen Reactions   Atorvastatin Other (See Comments)    Headache and fatigue    Ezetimibe Other (See Comments)    Joint pain    Pravachol [Pravastatin] Other (See Comments)    Pt can not recall SE    Past Surgical History:  Procedure Laterality Date   ANTERIOR CERVICAL DECOMP/DISCECTOMY FUSION N/A 07/21/2018   Procedure: ANTERIOR CERVICAL DECOMPRESSION/DISCECTOMY FUSION 3 LEVELS-C4-7;  Surgeon: Meade Maw, MD;  Location: [facility] ORS;  Service: Neurosurgery;  Laterality: N/A;   COLONOSCOPY WITH PROPOFOL N/A 02/26/2018   Procedure: COLONOSCOPY WITH PROPOFOL;  Surgeon: [name], MD;  Location: [facility] ENDOSCOPY;  Service: Gastroenterology;  Laterality: N/A;   ESOPHAGOGASTRODUODENOSCOPY (EGD) WITH PROPOFOL N/A 02/26/2018   Positive for Barrett's Esophagus. REPEAT 03/2021   ETHMOIDECTOMY Bilateral 08/08/2015   Procedure: TOTAL ETHMOIDECTOMY;  Surgeon: Carloyn Manner, MD;  Location: [place];  Service: ENT;  Laterality: Bilateral;  FRONTAL  SINUS EXPLORATION Left 08/08/2015   Procedure: FRONTAL SINUS EXPLORATION;  Surgeon: Carloyn Manner, MD;  Location: [place];  Service: ENT;  Laterality: Left;   IMAGE GUIDED SINUS SURGERY N/A 08/08/2015   Procedure: IMAGE GUIDED SINUS SURGERY;  Surgeon: Carloyn Manner, MD;  Location: [place];  Service: ENT;  Laterality: N/A;  GAVE DISK TO [facility] 12/8   MAXILLARY ANTROSTOMY Bilateral 08/08/2015   Procedure: MAXILLARY ANTROSTOMY;  Surgeon: Carloyn Manner, MD;  Location: [place];  Service: ENT;  Laterality: Bilateral;   MICROLARYNGOSCOPY     with biopsy   NASAL TURBINATE REDUCTION Bilateral 08/08/2015   Procedure: TURBINATE REDUCTION/SUBMUCOSAL RESECTION;  Surgeon: Carloyn Manner, MD;  Location: [place];  Service: ENT;  Laterality: Bilateral;   SEPTOPLASTY N/A 08/08/2015   Procedure: SEPTOPLASTY;  Surgeon: Carloyn Manner, MD;  Location: [place];  Service: ENT;  Laterality: N/A;   SPHENOIDECTOMY Left 08/08/2015   Procedure: Coralee Pesa;  Surgeon: Carloyn Manner, MD;  Location: [place];  Service: ENT;  Laterality: Left;   UPPER GASTROINTESTINAL ENDOSCOPY  2013   WISDOM TOOTH EXTRACTION      Social History   Tobacco Use   Smoking status: Current Every Day Smoker    Packs/day: 1.00    Years: 30.00    Pack years: 30.00    Types: Cigarettes   Smokeless tobacco: Never Used  Substance Use Topics   Alcohol use: Yes    Comment: 2 beers/month   Drug use: No     Medication list has been reviewed and updated.  Current Meds  Medication Sig   acetaminophen (TYLENOL) 500 MG tablet Take 1,000 mg by mouth every 6 (six) hours as needed.   Calcipotriene-Betameth Diprop (ENSTILAR EX) Apply 1 application topically daily as needed (psoriasis).   DEXILANT 60 MG capsule TAKE 1 CAPSULE (60 MG TOTAL) BY MOUTH 2 (TWO) TIMES DAILY.   fluticasone (FLONASE) 50 MCG/ACT nasal spray Place 2 sprays into both  nostrils daily.    methocarbamol (ROBAXIN) 500 MG tablet Take 1 tablet (500 mg total) by mouth every 6 (six) hours as needed for muscle spasms.   Multiple Vitamin (MULTIVITAMIN) tablet Take 1 tablet by mouth daily.   PARoxetine (PAXIL-CR) 25 MG 24 hr tablet TAKE 1 TABLET (25 MG TOTAL) BY MOUTH DAILY. AM    PHQ 2/9 Scores 12/21/2018 11/23/2018 12/18/2017 06/02/2017  PHQ - 2 Score 4 0 0 0  PHQ- 9 Score 5 - - -    BP Readings from Last 3 Encounters:  12/21/18 108/70  09/16/18 (!) 148/90  07/22/18 (!) 153/84    Physical Exam Vitals signs and nursing note reviewed.  Constitutional:      Appearance: Normal appearance. He is well-developed.  HENT:     Head: Normocephalic.     Right Ear: Tympanic membrane, ear canal and external ear normal.     Left Ear: Tympanic membrane, ear canal and external ear normal.     Nose: Nose normal.     Mouth/Throat:     Pharynx: Uvula midline.  Eyes:     Conjunctiva/sclera: Conjunctivae normal.     Pupils: Pupils are equal, round, and reactive to light.  Neck:     Musculoskeletal: Normal range of motion and neck supple.     Thyroid: No thyromegaly.     Vascular: No carotid bruit.  Cardiovascular:     Rate and Rhythm: Normal rate and regular rhythm.     Pulses:  Carotid pulses are 2+ on the right side and 2+ on the left side.      Radial pulses are 2+ on the right side and 2+ on the left side.       Femoral pulses are 2+ on the right side and 2+ on the left side.      Popliteal pulses are 2+ on the right side and 2+ on the left side.       Dorsalis pedis pulses are 2+ on the right side and 2+ on the left side.       Posterior tibial pulses are 2+ on the right side and 2+ on the left side.     Heart sounds: Normal heart sounds.  Pulmonary:     Effort: Pulmonary effort is normal.     Breath sounds: Normal breath sounds. No wheezing.  Chest:     Breasts:        Right: No mass.        Left: No mass.  Abdominal:     General: Bowel sounds are  normal.     Palpations: Abdomen is soft.     Tenderness: There is no abdominal tenderness.  Musculoskeletal:     Right shoulder: He exhibits decreased range of motion. He exhibits no bony tenderness and no swelling.     Right lower leg: No edema.     Left lower leg: No edema.  Lymphadenopathy:     Cervical: No cervical adenopathy.  Skin:    General: Skin is warm and dry.  Neurological:     Mental Status: He is alert and oriented to person, place, and time.     Motor: Motor function is intact.     Coordination: Coordination is intact.     Deep Tendon Reflexes:     Reflex Scores:      Bicep reflexes are 1+ on the right side and 1+ on the left side.      Achilles reflexes are 1+ on the right side and 1+ on the left side. Psychiatric:        Speech: Speech normal.        Behavior: Behavior normal.        Thought Content: Thought content normal.        Judgment: Judgment normal.     Wt Readings from Last 3 Encounters:  12/21/18 238 lb (108 kg)  11/23/18 220 lb (99.8 kg)  09/16/18 220 lb (99.8 kg)    BP 108/70    Pulse 70    Temp 98.7 F (37.1 C) (Oral)    Ht 5\' 9"  (1.753 m)    Wt 238 lb (108 kg)    SpO2 96%    BMI 35.15 kg/m   Assessment and Plan: 1. Annual physical exam Normal exam except for weight - work on diet and increase exercise - Comprehensive metabolic panel - Lipid panel - POCT urinalysis dipstick  2. Prostate cancer screening DRE deferred - PSA  3. Gastroesophageal reflux disease with esophagitis Controlled on PPI  4. Generalized anxiety disorder Doing well on current therapy  5. Tobacco use disorder Pt is not interested in quitting at this time due to increased stressors He is not a candidate for LDCT screening due to age < 61   Partially dictated using Editor, commissioning. Any errors are unintentional.  Halina Maidens, MD [facility]  12/21/2018

## 2018-12-22 DIAGNOSIS — R29898 Other symptoms and signs involving the musculoskeletal system: Secondary | ICD-10-CM | POA: Diagnosis not present

## 2018-12-22 DIAGNOSIS — M6281 Muscle weakness (generalized): Secondary | ICD-10-CM | POA: Diagnosis not present

## 2018-12-22 DIAGNOSIS — Z981 Arthrodesis status: Secondary | ICD-10-CM | POA: Diagnosis not present

## 2018-12-22 DIAGNOSIS — M542 Cervicalgia: Secondary | ICD-10-CM | POA: Diagnosis not present

## 2018-12-22 LAB — COMPREHENSIVE METABOLIC PANEL
ALT: 23 IU/L (ref 0–44)
AST: 22 IU/L (ref 0–40)
Albumin/Globulin Ratio: 2.1 (ref 1.2–2.2)
Albumin: 4.5 g/dL (ref 3.8–4.9)
Alkaline Phosphatase: 78 IU/L (ref 39–117)
BUN/Creatinine Ratio: 20 (ref 9–20)
BUN: 19 mg/dL (ref 6–24)
Bilirubin Total: 0.8 mg/dL (ref 0.0–1.2)
CO2: 22 mmol/L (ref 20–29)
Calcium: 9.4 mg/dL (ref 8.7–10.2)
Chloride: 103 mmol/L (ref 96–106)
Creatinine, Ser: 0.94 mg/dL (ref 0.76–1.27)
GFR calc Af Amer: 108 mL/min/{1.73_m2} (ref >59)
GFR calc non Af Amer: 93 mL/min/{1.73_m2} (ref >59)
Globulin, Total: 2.1 g/dL (ref 1.5–4.5)
Glucose: 103 mg/dL — ABNORMAL HIGH (ref 65–99)
Potassium: 4.7 mmol/L (ref 3.5–5.2)
Sodium: 139 mmol/L (ref 134–144)
Total Protein: 6.6 g/dL (ref 6.0–8.5)

## 2018-12-22 LAB — LIPID PANEL
Chol/HDL Ratio: 5.8 ratio — ABNORMAL HIGH (ref 0.0–5.0)
Cholesterol, Total: 186 mg/dL (ref 100–199)
HDL: 32 mg/dL — ABNORMAL LOW (ref >39)
LDL Calculated: 131 mg/dL — ABNORMAL HIGH (ref 0–99)
Triglycerides: 114 mg/dL (ref 0–149)
VLDL Cholesterol Cal: 23 mg/dL (ref 5–40)

## 2018-12-22 LAB — PSA: Prostate Specific Ag, Serum: 1.6 ng/mL (ref 0.0–4.0)

## 2018-12-30 DIAGNOSIS — M5412 Radiculopathy, cervical region: Secondary | ICD-10-CM | POA: Diagnosis not present

## 2018-12-30 DIAGNOSIS — M4322 Fusion of spine, cervical region: Secondary | ICD-10-CM | POA: Diagnosis not present

## 2019-01-10 IMAGING — MR MR CERVICAL SPINE W/O CM
5 series · 33 of 48 positions shown · non-contrast
Comparison: None.

CLINICAL DATA: Neck pain, right arm pain

EXAM:
MRI CERVICAL SPINE WITHOUT CONTRAST
TECHNIQUE: Multiplanar, multisequence MR imaging of the cervical spine was
performed. No intravenous contrast was administered.

[Series 3: T1 · sagittal · 3.0mm · 0.70mm/px · 6 of 13 slices shown]
[im 1/13]
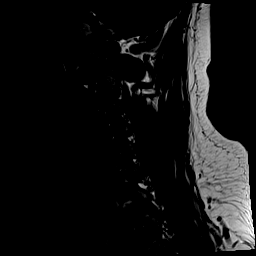
[im 3/13]
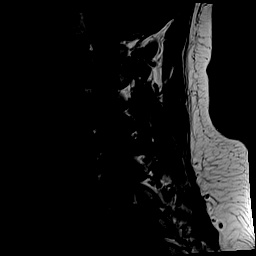
[im 5/13]
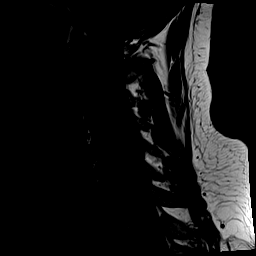
[im 8/13]
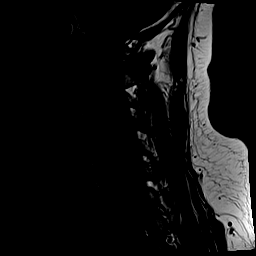
[im 10/13]
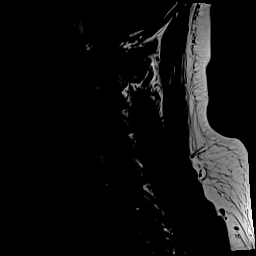
[im 13/13]
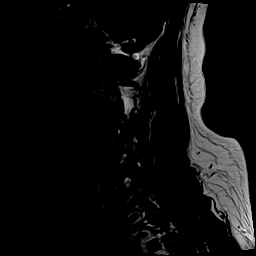

[Series 4: STIR · sagittal · 3.0mm · 0.35mm/px · 7 of 13 slices shown]
[im 1/13]
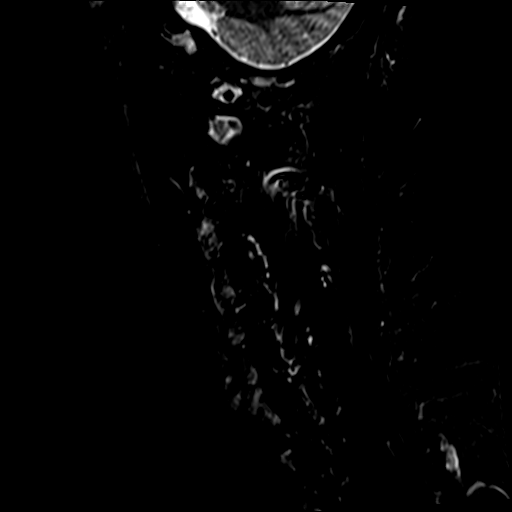
[im 3/13]
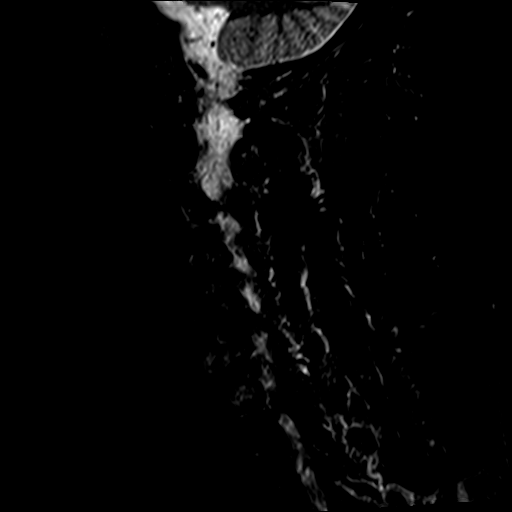
[im 5/13]
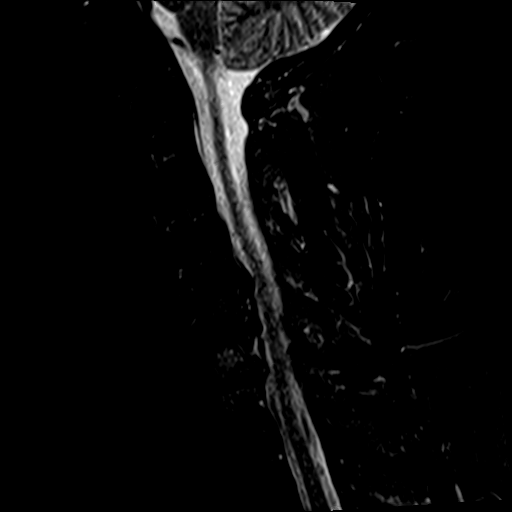
[im 7/13]
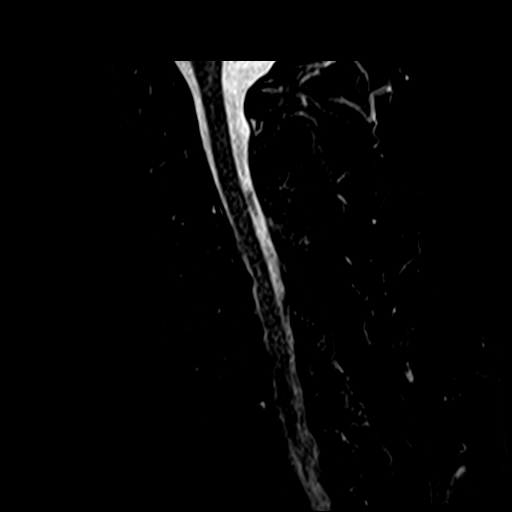
[im 9/13]
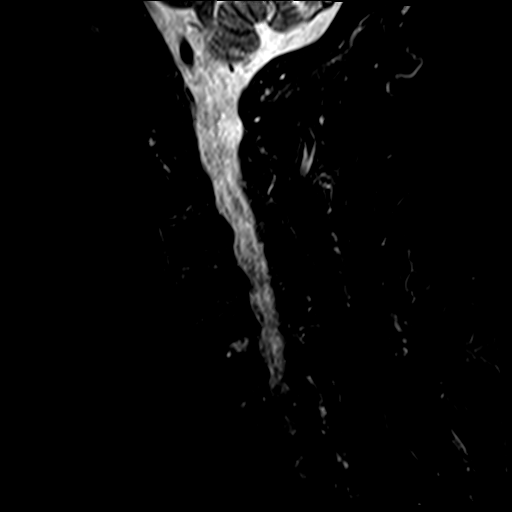
[im 11/13]
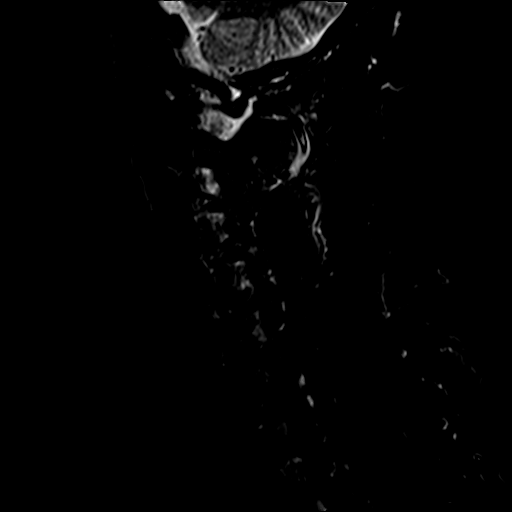
[im 13/13]
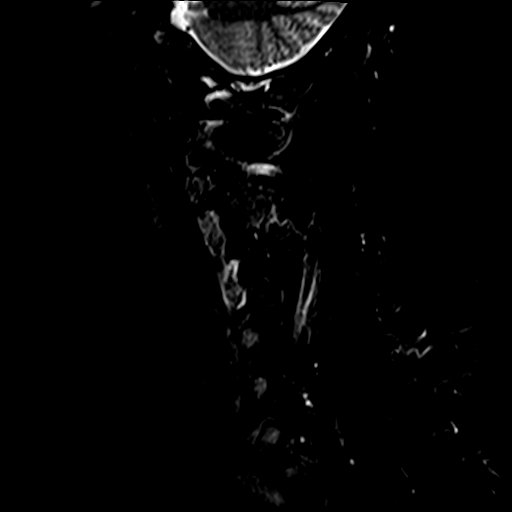

[Series 5: T2 · sagittal · 3.0mm · 0.56mm/px · 7 of 13 slices shown (1 of 2)]
[im 1/13]
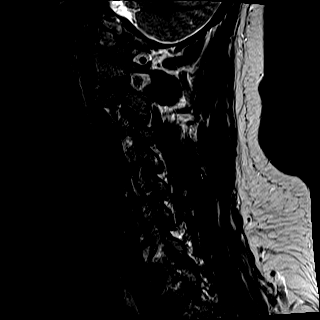
[im 3/13]
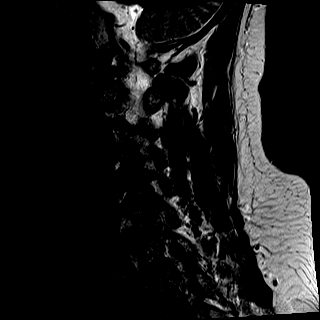
[im 5/13]
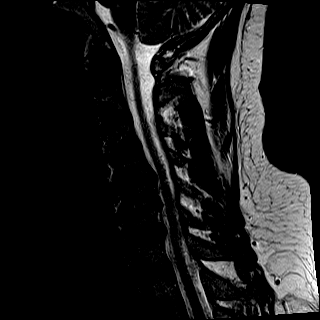
[im 7/13]
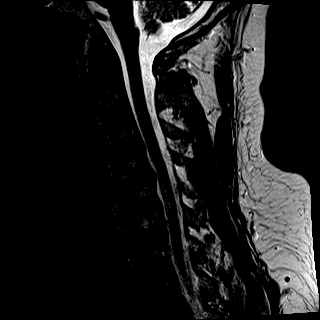
[im 9/13]
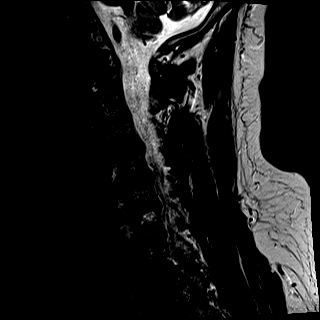
[im 11/13]
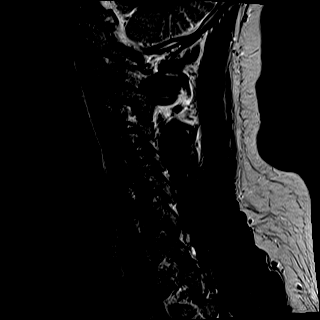
[im 13/13]
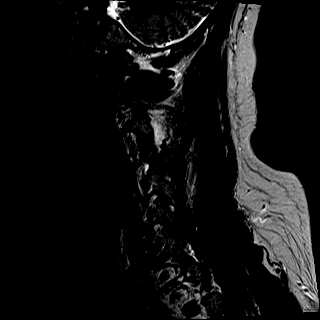

[Series 6: T2 · axial · 3.0mm · 0.62mm/px · z∈[-85,+15]mm · 8 of 27 slices shown (2 of 2)]
[im 1/27]
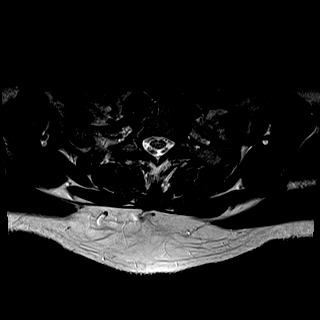
[im 5/27]
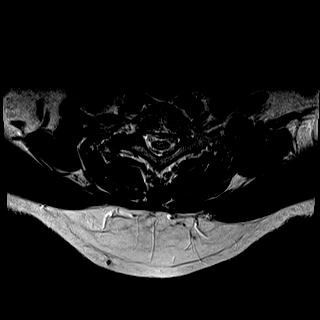
[im 9/27]
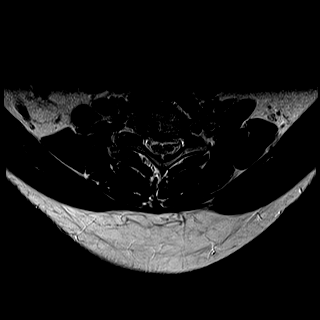
[im 13/27]
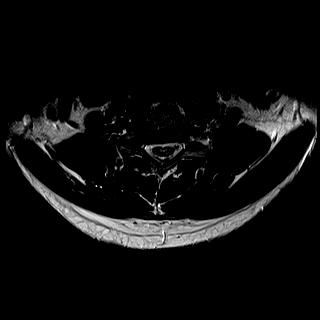
[im 15/27]
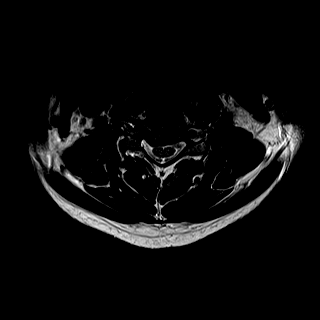
[im 19/27]
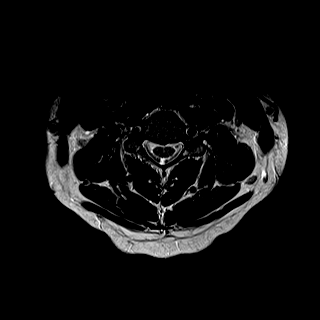
[im 23/27]
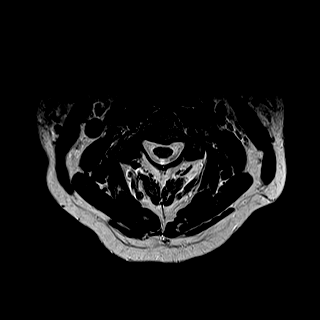
[im 27/27]
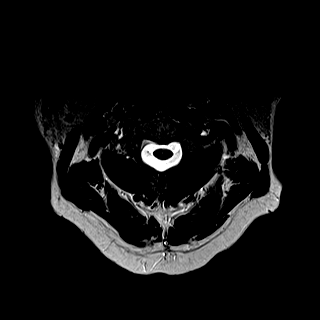

[Series 7: mpgr ax · axial · 3.0mm · 0.39mm/px · z∈[-78,-26]mm · 5 of 28 slices shown]
[im 1/28]
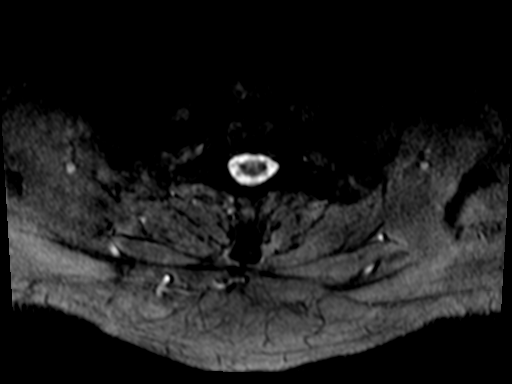
[im 5/28]
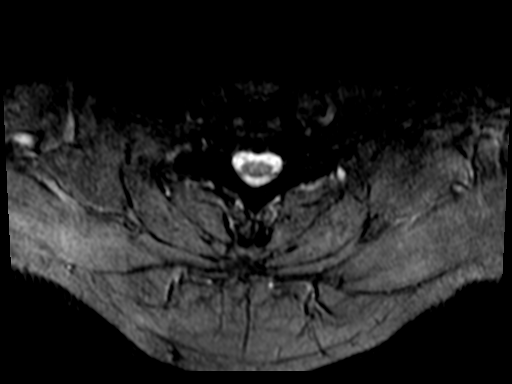
[im 9/28]
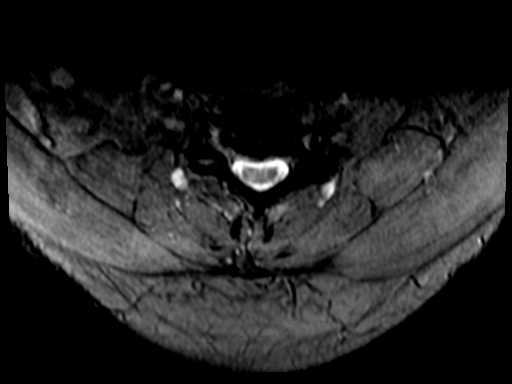
[im 13/28]
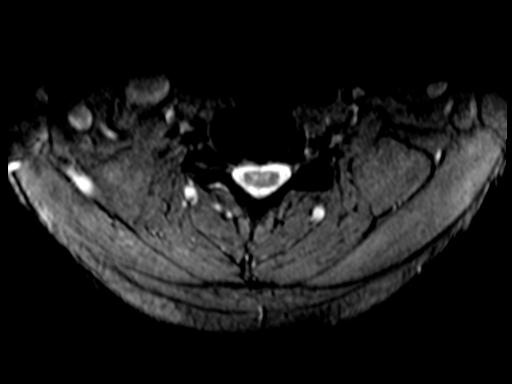
[im 15/28]
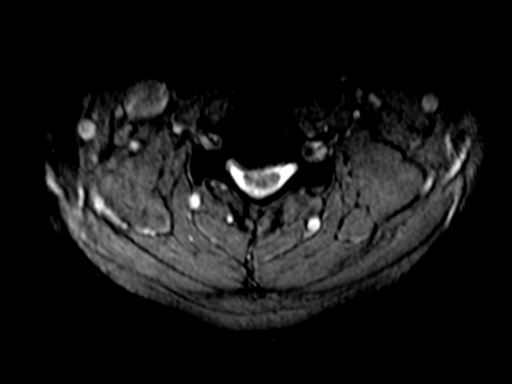

[33 of 48 positions shown; findings below may reference images not displayed]

FINDINGS: Alignment: Mild retrolisthesis C6-7. Straightening of the cervical
lordosis

Vertebrae: Normal bone marrow.  Negative for fracture or mass

Cord: Normal signal and morphology

Posterior Fossa, vertebral arteries, paraspinal tissues: Negative

Disc levels:

C2-3: Mild degenerative change without stenosis

C3-4: Mild disc and mild facet degeneration. Small central disc
protrusion. Mild left foraminal narrowing due to spurring

C4-5: Disc degeneration and spondylosis. Uncinate spurring right
greater than left. Cord flattening with mild spinal stenosis.
Moderate right foraminal narrowing and mild left foraminal
narrowing.

C5-6: Disc degeneration and spondylosis. Severe right foraminal
encroachment due to spurring. Mild left foraminal narrowing and mild
spinal stenosis

C6-7: Disc degeneration and spondylosis. Small central disc
protrusion. Mild spinal stenosis and mild foraminal stenosis
bilaterally

C7-T1: Bilateral facet degeneration. Mild foraminal narrowing
bilaterally.
IMPRESSION: Mild left foraminal narrowing C3-4

Mild spinal stenosis C4-5 with moderate right foraminal narrowing
and mild left foraminal narrowing

Mild spinal stenosis C6-7 with severe right foraminal encroachment
due to spurring.

Mild spinal stenosis C6-7 with mild foraminal narrowing bilaterally

Mild foraminal narrowing bilaterally at C7-T1

## 2019-01-13 ENCOUNTER — Other Ambulatory Visit: Payer: Self-pay | Admitting: Neurosurgery

## 2019-01-13 DIAGNOSIS — Z981 Arthrodesis status: Secondary | ICD-10-CM

## 2019-01-13 DIAGNOSIS — M5412 Radiculopathy, cervical region: Secondary | ICD-10-CM

## 2019-01-17 DIAGNOSIS — M25561 Pain in right knee: Secondary | ICD-10-CM | POA: Diagnosis not present

## 2019-01-18 ENCOUNTER — Other Ambulatory Visit: Payer: Self-pay

## 2019-01-18 ENCOUNTER — Ambulatory Visit
Admission: RE | Admit: 2019-01-18 | Discharge: 2019-01-18 | Disposition: A | Discharge status: Home / Self Care | Payer: BC Managed Care – PPO | Source: Ambulatory Visit | Attending: Neurosurgery | Admitting: Neurosurgery

## 2019-01-18 DIAGNOSIS — M5021 Other cervical disc displacement,  high cervical region: Secondary | ICD-10-CM | POA: Diagnosis not present

## 2019-01-18 DIAGNOSIS — M5412 Radiculopathy, cervical region: Secondary | ICD-10-CM | POA: Insufficient documentation

## 2019-01-18 DIAGNOSIS — M47812 Spondylosis without myelopathy or radiculopathy, cervical region: Secondary | ICD-10-CM | POA: Diagnosis not present

## 2019-01-18 DIAGNOSIS — Z981 Arthrodesis status: Secondary | ICD-10-CM | POA: Diagnosis not present

## 2019-01-26 ENCOUNTER — Other Ambulatory Visit: Payer: Self-pay

## 2019-01-26 ENCOUNTER — Ambulatory Visit
Admission: RE | Admit: 2019-01-26 | Discharge: 2019-01-26 | Disposition: A | Discharge status: Home / Self Care | Payer: BC Managed Care – PPO | Source: Ambulatory Visit | Attending: Neurosurgery | Admitting: Neurosurgery

## 2019-01-26 DIAGNOSIS — M5412 Radiculopathy, cervical region: Secondary | ICD-10-CM | POA: Diagnosis not present

## 2019-01-26 DIAGNOSIS — M542 Cervicalgia: Secondary | ICD-10-CM | POA: Diagnosis not present

## 2019-01-26 DIAGNOSIS — Z981 Arthrodesis status: Secondary | ICD-10-CM | POA: Diagnosis not present

## 2019-02-02 DIAGNOSIS — M47812 Spondylosis without myelopathy or radiculopathy, cervical region: Secondary | ICD-10-CM | POA: Diagnosis not present

## 2019-02-17 DIAGNOSIS — S129XXA Fracture of neck, unspecified, initial encounter: Secondary | ICD-10-CM | POA: Diagnosis not present

## 2019-02-23 ENCOUNTER — Other Ambulatory Visit: Payer: Self-pay | Admitting: Internal Medicine

## 2019-02-23 DIAGNOSIS — K2100 Gastro-esophageal reflux disease with esophagitis, without bleeding: Secondary | ICD-10-CM

## 2019-03-07 DIAGNOSIS — M47812 Spondylosis without myelopathy or radiculopathy, cervical region: Secondary | ICD-10-CM | POA: Diagnosis not present

## 2019-05-17 DIAGNOSIS — Z23 Encounter for immunization: Secondary | ICD-10-CM | POA: Diagnosis not present

## 2019-05-23 DIAGNOSIS — M47812 Spondylosis without myelopathy or radiculopathy, cervical region: Secondary | ICD-10-CM | POA: Diagnosis not present

## 2019-06-14 ENCOUNTER — Other Ambulatory Visit: Payer: Self-pay | Admitting: Internal Medicine

## 2019-06-14 DIAGNOSIS — K2100 Gastro-esophageal reflux disease with esophagitis, without bleeding: Secondary | ICD-10-CM

## 2019-06-27 DIAGNOSIS — M47812 Spondylosis without myelopathy or radiculopathy, cervical region: Secondary | ICD-10-CM | POA: Diagnosis not present

## 2019-07-18 DIAGNOSIS — M5030 Other cervical disc degeneration, unspecified cervical region: Secondary | ICD-10-CM | POA: Diagnosis not present

## 2019-07-18 DIAGNOSIS — M47812 Spondylosis without myelopathy or radiculopathy, cervical region: Secondary | ICD-10-CM | POA: Diagnosis not present

## 2019-08-03 ENCOUNTER — Other Ambulatory Visit: Payer: Self-pay | Admitting: Internal Medicine

## 2019-08-03 DIAGNOSIS — F411 Generalized anxiety disorder: Secondary | ICD-10-CM

## 2019-08-10 ENCOUNTER — Telehealth: Payer: Self-pay | Admitting: Internal Medicine

## 2019-08-10 ENCOUNTER — Other Ambulatory Visit: Payer: Self-pay | Admitting: Internal Medicine

## 2019-08-10 NOTE — Telephone Encounter (Signed)
I received a call from Mr. David Kennedy requesting a referral to neurosurgery for his neck pain. He has seen Dr. Cari Caraway and had cervical fusion C4-7 2019.  He has not done well with ongoing pain.  He has tried PT and steroid injections.  He was referred to Dr. Sharlet Salina who has recommended ablation.  However, insurance will not cover this procedure, despite the fact that Dr. Sharlet Salina did a peer to peer call.  Insurance suggested that patient write a letter explaining his situation and why he wants to try the ablation. He feels like no one has helped him and he wants a referral to another neurosurgeon. I explained to him that he needs to go ahead and write a letter.  His phone call this morning will not reach the correct department.  If I refer him to another MD, they will either refuse to see him or start the entire process of evaluation, PT, ESI over again.  As I was talking, he hung up the phone without speaking.  I called back to be sure that he hung up on purpose and that we did not need to have more discussion but the call went to voice mail - I left a message to that effect.

## 2019-09-05 DIAGNOSIS — M960 Pseudarthrosis after fusion or arthrodesis: Secondary | ICD-10-CM | POA: Diagnosis not present

## 2019-09-05 DIAGNOSIS — S129XXS Fracture of neck, unspecified, sequela: Secondary | ICD-10-CM | POA: Diagnosis not present

## 2019-09-05 DIAGNOSIS — Z01818 Encounter for other preprocedural examination: Secondary | ICD-10-CM | POA: Diagnosis not present

## 2019-09-23 ENCOUNTER — Other Ambulatory Visit: Payer: Self-pay | Admitting: Neurosurgery

## 2019-10-07 ENCOUNTER — Encounter
Admission: RE | Admit: 2019-10-07 | Discharge: 2019-10-07 | Disposition: A | Discharge status: Home / Self Care | Payer: BC Managed Care – PPO | Source: Ambulatory Visit | Attending: Neurosurgery | Admitting: Neurosurgery

## 2019-10-07 DIAGNOSIS — M960 Pseudarthrosis after fusion or arthrodesis: Secondary | ICD-10-CM | POA: Diagnosis not present

## 2019-10-07 DIAGNOSIS — Z01812 Encounter for preprocedural laboratory examination: Secondary | ICD-10-CM | POA: Diagnosis not present

## 2019-10-07 NOTE — Patient Instructions (Addendum)
Your procedure is scheduled on: 10-19-19 [facility] Report to Same Day Surgery 2nd floor medical mall [facility] Entrance-take elevator on left to 2nd floor.  Check in with surgery information desk.) To find out your arrival time please call [phone] between 1PM - 3PM on 10-18-19 TUESDAY  Remember: Instructions that are not followed completely may result in serious medical risk, up to and including death, or upon the discretion of your surgeon and anesthesiologist your surgery may need to be rescheduled.    _x___ 1. Do not eat food after midnight the night before your procedure. NO GUM OR CANDY AFTER MIDNIGHT. You may drink clear liquids up to 2 hours before you are scheduled to arrive at the hospital for your procedure.  Do not drink clear liquids within 2 hours of your scheduled arrival to the hospital.  Clear liquids include  --Water or Apple juice without pulp  --Gatorade  --Black Coffee or Clear Tea (No milk, no creamers, do not add anything to the coffee or Tea   ____Ensure clear carbohydrate drink on the way to the hospital for bariatric patients  ____Ensure clear carbohydrate drink 3 hours before surgery.    __x__ 2. No Alcohol for 24 hours before or after surgery.   __x__3. No Smoking or e-cigarettes for 24 prior to surgery.  Do not use any chewable tobacco products for at least 6 hour prior to surgery   ____  4. Bring all medications with you on the day of surgery if instructed.    __x__ 5. Notify your doctor if there is any change in your medical condition     (cold, fever, infections).    x___6. On the morning of surgery brush your teeth with toothpaste and water.  You may rinse your mouth with mouth wash if you wish.  Do not swallow any toothpaste or mouthwash.   Do not wear jewelry, make-up, hairpins, clips or nail polish.  Do not wear lotions, powders, or perfumes.   Do not shave 48 hours prior to surgery. Men may shave face and neck.  Do not bring valuables to  the hospital.    [facility] is not responsible for any belongings or valuables.               Contacts, dentures or bridgework may not be worn into surgery.  Leave your suitcase in the car. After surgery it may be brought to your room.  For patients admitted to the hospital, discharge time is determined by your treatment team.  _  Patients discharged the day of surgery will not be allowed to drive home.  You will need someone to drive you home and stay with you the night of your procedure.    Please read over the following fact sheets that you were given:   [facility] Preparing for Surgery/MRSA INFORMATION  _x___ TAKE THE FOLLOWING MEDICATION THE MORNING OF SURGERY WITH A SMALL SIP OF WATER. These include:  1. DEXILANT (DEXLANSOPRAZOLE)  2. PAXIL (PAROXETINE)  3.  4.  5.  6.  ____Fleets enema or Magnesium Citrate as directed.   _x___ Use CHG Soap or sage wipes as directed on instruction sheet   ____ Use inhalers on the day of surgery and bring to hospital day of surgery  ____ Stop Metformin and Janumet 2 days prior to surgery.    ____ Take 1/2 of usual insulin dose the night before surgery and none on the morning surgery.   ____ Follow recommendations from Cardiologist, Pulmonologist or  PCP regarding stopping Aspirin, Coumadin, Plavix ,Eliquis, Effient, or Pradaxa, and Pletal.  X____Stop Anti-inflammatories such as Advil, Aleve, Ibuprofen, Motrin, Naproxen, Naprosyn, Goodies powders or aspirin products 7 DAYS PRIOR TO SURGERY-OK to take Tylenol    ____ Stop supplements until after surgery.     ____ Bring C-Pap to the hospital.

## 2019-10-07 NOTE — Pre-Procedure Instructions (Signed)
ECG 12-lead1/18/2021 [place] Component Name Value Ref Range  Vent Rate (bpm) 81   PR Interval (msec) 138   QRS Interval (msec) 90   QT Interval (msec) 378   QTc (msec) 439   Other Result Information  This result has an attachment that is not available.  Result Narrative  Normal sinus rhythm Nonspecific T wave abnormalities Abnormal ECG No previous ECGs available I reviewed and concur with this report. Electronically signed [id number] MD, KEN [id number]) on 09/07/2019 8:43:15 AM  Status Results Details   Encounter Summary

## 2019-10-17 ENCOUNTER — Other Ambulatory Visit: Payer: Self-pay

## 2019-10-17 ENCOUNTER — Other Ambulatory Visit
Admission: RE | Admit: 2019-10-17 | Discharge: 2019-10-17 | Disposition: A | Discharge status: Home / Self Care | Payer: BC Managed Care – PPO | Source: Ambulatory Visit | Attending: Neurosurgery | Admitting: Neurosurgery

## 2019-10-17 DIAGNOSIS — Z20822 Contact with and (suspected) exposure to covid-19: Secondary | ICD-10-CM | POA: Diagnosis not present

## 2019-10-17 DIAGNOSIS — H5203 Hypermetropia, bilateral: Secondary | ICD-10-CM | POA: Diagnosis not present

## 2019-10-17 DIAGNOSIS — Z01812 Encounter for preprocedural laboratory examination: Secondary | ICD-10-CM | POA: Insufficient documentation

## 2019-10-17 LAB — TYPE AND SCREEN
ABO/RH(D): A POS
Antibody Screen: NEGATIVE

## 2019-10-17 LAB — SURGICAL PCR SCREEN
MRSA, PCR: NEGATIVE
Staphylococcus aureus: NEGATIVE

## 2019-10-17 LAB — SARS CORONAVIRUS 2 (TAT 6-24 HRS): SARS Coronavirus 2: NEGATIVE

## 2019-10-19 ENCOUNTER — Encounter: Payer: Self-pay | Admitting: Neurosurgery

## 2019-10-19 ENCOUNTER — Inpatient Hospital Stay: Payer: BC Managed Care – PPO

## 2019-10-19 ENCOUNTER — Encounter
Admission: RE | Disposition: A | Discharge status: Home / Self Care | Payer: Self-pay | Source: Home / Self Care | Attending: Neurosurgery

## 2019-10-19 ENCOUNTER — Inpatient Hospital Stay: Payer: BC Managed Care – PPO | Admitting: Registered Nurse

## 2019-10-19 ENCOUNTER — Inpatient Hospital Stay
Admission: RE | Admit: 2019-10-19 | Discharge: 2019-10-21 | DRG: 473 | Disposition: A | Discharge status: Home / Self Care | Payer: BC Managed Care – PPO | Attending: Neurosurgery | Admitting: Neurosurgery

## 2019-10-19 ENCOUNTER — Other Ambulatory Visit: Payer: Self-pay

## 2019-10-19 DIAGNOSIS — M4322 Fusion of spine, cervical region: Secondary | ICD-10-CM | POA: Diagnosis not present

## 2019-10-19 DIAGNOSIS — Z9109 Other allergy status, other than to drugs and biological substances: Secondary | ICD-10-CM | POA: Diagnosis not present

## 2019-10-19 DIAGNOSIS — Y838 Other surgical procedures as the cause of abnormal reaction of the patient, or of later complication, without mention of misadventure at the time of the procedure: Secondary | ICD-10-CM | POA: Diagnosis present

## 2019-10-19 DIAGNOSIS — M5031 Other cervical disc degeneration,  high cervical region: Secondary | ICD-10-CM | POA: Diagnosis not present

## 2019-10-19 DIAGNOSIS — M960 Pseudarthrosis after fusion or arthrodesis: Principal | ICD-10-CM | POA: Diagnosis present

## 2019-10-19 DIAGNOSIS — K219 Gastro-esophageal reflux disease without esophagitis: Secondary | ICD-10-CM | POA: Diagnosis not present

## 2019-10-19 DIAGNOSIS — Z87891 Personal history of nicotine dependence: Secondary | ICD-10-CM | POA: Diagnosis not present

## 2019-10-19 DIAGNOSIS — Z419 Encounter for procedure for purposes other than remedying health state, unspecified: Secondary | ICD-10-CM

## 2019-10-19 DIAGNOSIS — Z981 Arthrodesis status: Secondary | ICD-10-CM | POA: Diagnosis not present

## 2019-10-19 DIAGNOSIS — Z79899 Other long term (current) drug therapy: Secondary | ICD-10-CM | POA: Diagnosis not present

## 2019-10-19 HISTORY — PX: POSTERIOR CERVICAL FUSION/FORAMINOTOMY: SHX5038

## 2019-10-19 SURGERY — POSTERIOR CERVICAL FUSION/FORAMINOTOMY LEVEL 3
Anesthesia: General

## 2019-10-19 MED ORDER — ROCURONIUM BROMIDE 100 MG/10ML IV SOLN
INTRAVENOUS | Status: DC | PRN
  Administered 2019-10-19: 50 mg via INTRAVENOUS

## 2019-10-19 MED ORDER — PROPOFOL 500 MG/50ML IV EMUL
INTRAVENOUS | Status: AC
  Filled 2019-10-19: qty 50

## 2019-10-19 MED ORDER — PHENOL 1.4 % MT LIQD
1.0000 | OROMUCOSAL | Status: DC | PRN
  Filled 2019-10-19: qty 177

## 2019-10-19 MED ORDER — SODIUM CHLORIDE 0.9 % IV SOLN: 250.0000 mL | INTRAVENOUS | Status: DC

## 2019-10-19 MED ORDER — SODIUM CHLORIDE 0.9% FLUSH: 3.0000 mL | INTRAVENOUS | Status: DC | PRN

## 2019-10-19 MED ORDER — EPHEDRINE SULFATE 50 MG/ML IJ SOLN
INTRAMUSCULAR | Status: DC | PRN
  Administered 2019-10-19 (×4): 5 mg via INTRAVENOUS

## 2019-10-19 MED ORDER — CEFAZOLIN SODIUM-DEXTROSE 2-4 GM/100ML-% IV SOLN
INTRAVENOUS | Status: AC
  Filled 2019-10-19: qty 100

## 2019-10-19 MED ORDER — SODIUM CHLORIDE 0.9% FLUSH
3.0000 mL | Freq: Two times a day (BID) | INTRAVENOUS | Status: DC
  Administered 2019-10-19 – 2019-10-20 (×3): 3 mL via INTRAVENOUS

## 2019-10-19 MED ORDER — FENTANYL CITRATE (PF) 100 MCG/2ML IJ SOLN
INTRAMUSCULAR | Status: AC
  Administered 2019-10-19: 25 ug via INTRAVENOUS
  Filled 2019-10-19: qty 2

## 2019-10-19 MED ORDER — PHENYLEPHRINE HCL (PRESSORS) 10 MG/ML IV SOLN
INTRAVENOUS | Status: DC | PRN
  Administered 2019-10-19 (×2): 200 ug via INTRAVENOUS
  Administered 2019-10-19: 100 ug via INTRAVENOUS
  Administered 2019-10-19: 200 ug via INTRAVENOUS

## 2019-10-19 MED ORDER — PROPOFOL 500 MG/50ML IV EMUL
INTRAVENOUS | Status: DC | PRN
  Administered 2019-10-19: 125 ug/kg/min via INTRAVENOUS

## 2019-10-19 MED ORDER — SODIUM CHLORIDE 0.9% FLUSH
INTRAVENOUS | Status: DC | PRN
  Administered 2019-10-19: 10 mL

## 2019-10-19 MED ORDER — MIDAZOLAM HCL 2 MG/2ML IJ SOLN
INTRAMUSCULAR | Status: DC | PRN
  Administered 2019-10-19: 2 mg via INTRAVENOUS

## 2019-10-19 MED ORDER — PROPOFOL 10 MG/ML IV BOLUS
INTRAVENOUS | Status: AC
  Filled 2019-10-19: qty 20

## 2019-10-19 MED ORDER — SUGAMMADEX SODIUM 200 MG/2ML IV SOLN
INTRAVENOUS | Status: AC
  Filled 2019-10-19: qty 2

## 2019-10-19 MED ORDER — SODIUM CHLORIDE 0.9 % IV SOLN
INTRAVENOUS | Status: DC | PRN
  Administered 2019-10-19: 40 mL

## 2019-10-19 MED ORDER — REMIFENTANIL HCL 1 MG IV SOLR
INTRAVENOUS | Status: AC
  Filled 2019-10-19: qty 1000

## 2019-10-19 MED ORDER — SODIUM CHLORIDE 0.9 % IV SOLN
INTRAVENOUS | Status: DC | PRN
  Administered 2019-10-19: 40 ug/min via INTRAVENOUS

## 2019-10-19 MED ORDER — LIDOCAINE HCL (CARDIAC) PF 100 MG/5ML IV SOSY
PREFILLED_SYRINGE | INTRAVENOUS | Status: DC | PRN
  Administered 2019-10-19: 50 mg via INTRATRACHEAL

## 2019-10-19 MED ORDER — BUPIVACAINE HCL (PF) 0.5 % IJ SOLN
INTRAMUSCULAR | Status: DC | PRN
  Administered 2019-10-19: 20 mL

## 2019-10-19 MED ORDER — FENTANYL CITRATE (PF) 100 MCG/2ML IJ SOLN
INTRAMUSCULAR | Status: AC
  Filled 2019-10-19: qty 2

## 2019-10-19 MED ORDER — FLUTICASONE PROPIONATE 50 MCG/ACT NA SUSP
2.0000 | Freq: Every day | NASAL | Status: DC | PRN
  Administered 2019-10-20: 2 via NASAL
  Filled 2019-10-19: qty 16

## 2019-10-19 MED ORDER — SODIUM CHLORIDE 0.9 % IR SOLN
Status: DC | PRN
  Administered 2019-10-19: 1000 mL

## 2019-10-19 MED ORDER — EPHEDRINE SULFATE 50 MG/ML IJ SOLN
INTRAMUSCULAR | Status: AC
  Filled 2019-10-19: qty 1

## 2019-10-19 MED ORDER — THROMBIN 5000 UNITS EX SOLR
CUTANEOUS | Status: DC | PRN
  Administered 2019-10-19: 5000 [IU] via TOPICAL

## 2019-10-19 MED ORDER — OXYCODONE HCL 5 MG PO TABS: 5.0000 mg | ORAL_TABLET | ORAL | Status: DC | PRN

## 2019-10-19 MED ORDER — BUPIVACAINE-EPINEPHRINE (PF) 0.5% -1:200000 IJ SOLN
INTRAMUSCULAR | Status: DC | PRN
  Administered 2019-10-19: 10 mL

## 2019-10-19 MED ORDER — PAROXETINE HCL ER 12.5 MG PO TB24
25.0000 mg | ORAL_TABLET | Freq: Every day | ORAL | Status: DC
  Administered 2019-10-20 – 2019-10-21 (×2): 25 mg via ORAL
  Filled 2019-10-19 (×2): qty 2

## 2019-10-19 MED ORDER — METHOCARBAMOL 500 MG PO TABS
1000.0000 mg | ORAL_TABLET | Freq: Four times a day (QID) | ORAL | Status: DC
  Administered 2019-10-19 – 2019-10-21 (×7): 1000 mg via ORAL
  Filled 2019-10-19 (×7): qty 2

## 2019-10-19 MED ORDER — MAGNESIUM CITRATE PO SOLN
1.0000 | Freq: Once | ORAL | Status: DC | PRN
  Filled 2019-10-19: qty 296

## 2019-10-19 MED ORDER — SODIUM CHLORIDE 0.9 % IV SOLN: INTRAVENOUS | Status: DC

## 2019-10-19 MED ORDER — POLYETHYLENE GLYCOL 3350 17 G PO PACK: 17.0000 g | PACK | Freq: Every day | ORAL | Status: DC | PRN

## 2019-10-19 MED ORDER — ACETAMINOPHEN 325 MG PO TABS: 650.0000 mg | ORAL_TABLET | ORAL | Status: DC | PRN

## 2019-10-19 MED ORDER — LACTATED RINGERS IV SOLN: INTRAVENOUS | Status: DC

## 2019-10-19 MED ORDER — OXYCODONE HCL 5 MG PO TABS
10.0000 mg | ORAL_TABLET | ORAL | Status: DC | PRN
  Administered 2019-10-19 – 2019-10-21 (×6): 10 mg via ORAL
  Filled 2019-10-19 (×6): qty 2

## 2019-10-19 MED ORDER — ACETAMINOPHEN 10 MG/ML IV SOLN
INTRAVENOUS | Status: AC
  Filled 2019-10-19: qty 100

## 2019-10-19 MED ORDER — FENTANYL CITRATE (PF) 100 MCG/2ML IJ SOLN
25.0000 ug | INTRAMUSCULAR | Status: AC | PRN
  Administered 2019-10-19 (×6): 25 ug via INTRAVENOUS

## 2019-10-19 MED ORDER — ACETAMINOPHEN 650 MG RE SUPP: 650.0000 mg | RECTAL | Status: DC | PRN

## 2019-10-19 MED ORDER — REMIFENTANIL HCL 1 MG IV SOLR
INTRAVENOUS | Status: DC | PRN
  Administered 2019-10-19: .2 ug/kg/min via INTRAVENOUS

## 2019-10-19 MED ORDER — BACITRACIN 500 UNIT/GM EX OINT
TOPICAL_OINTMENT | CUTANEOUS | Status: DC | PRN
  Administered 2019-10-19: 1 via TOPICAL

## 2019-10-19 MED ORDER — SUGAMMADEX SODIUM 200 MG/2ML IV SOLN
INTRAVENOUS | Status: DC | PRN
  Administered 2019-10-19: 200 mg via INTRAVENOUS

## 2019-10-19 MED ORDER — ONDANSETRON HCL 4 MG/2ML IJ SOLN: 4.0000 mg | Freq: Once | INTRAMUSCULAR | Status: DC | PRN

## 2019-10-19 MED ORDER — BISACODYL 5 MG PO TBEC: 5.0000 mg | DELAYED_RELEASE_TABLET | Freq: Every day | ORAL | Status: DC | PRN

## 2019-10-19 MED ORDER — LACTATED RINGERS IV SOLN: INTRAVENOUS | Status: DC | PRN

## 2019-10-19 MED ORDER — METHOCARBAMOL 1000 MG/10ML IJ SOLN
500.0000 mg | Freq: Four times a day (QID) | INTRAVENOUS | Status: DC
  Administered 2019-10-19: 500 mg via INTRAVENOUS
  Filled 2019-10-19 (×11): qty 5

## 2019-10-19 MED ORDER — MIDAZOLAM HCL 2 MG/2ML IJ SOLN
INTRAMUSCULAR | Status: AC
  Filled 2019-10-19: qty 2

## 2019-10-19 MED ORDER — ACETAMINOPHEN 10 MG/ML IV SOLN
INTRAVENOUS | Status: DC | PRN
  Administered 2019-10-19: 1000 mg via INTRAVENOUS

## 2019-10-19 MED ORDER — HYDROMORPHONE HCL 1 MG/ML IJ SOLN
0.5000 mg | INTRAMUSCULAR | Status: DC | PRN
  Administered 2019-10-19 – 2019-10-20 (×2): 0.5 mg via INTRAVENOUS
  Filled 2019-10-19 (×2): qty 1

## 2019-10-19 MED ORDER — SENNA 8.6 MG PO TABS
1.0000 | ORAL_TABLET | Freq: Two times a day (BID) | ORAL | Status: DC
  Administered 2019-10-19 – 2019-10-21 (×4): 8.6 mg via ORAL
  Filled 2019-10-19 (×5): qty 1

## 2019-10-19 MED ORDER — CEFAZOLIN SODIUM-DEXTROSE 2-4 GM/100ML-% IV SOLN
2.0000 g | INTRAVENOUS | Status: AC
  Administered 2019-10-19: 2 g via INTRAVENOUS

## 2019-10-19 MED ORDER — DIAZEPAM 5 MG PO TABS
5.0000 mg | ORAL_TABLET | Freq: Once | ORAL | Status: AC
  Administered 2019-10-19: 5 mg via ORAL
  Filled 2019-10-19: qty 1

## 2019-10-19 MED ORDER — ONDANSETRON HCL 4 MG PO TABS: 4.0000 mg | ORAL_TABLET | Freq: Four times a day (QID) | ORAL | Status: DC | PRN

## 2019-10-19 MED ORDER — LIDOCAINE HCL (PF) 2 % IJ SOLN
INTRAMUSCULAR | Status: AC
  Filled 2019-10-19: qty 5

## 2019-10-19 MED ORDER — DEXAMETHASONE SODIUM PHOSPHATE 10 MG/ML IJ SOLN
INTRAMUSCULAR | Status: AC
  Filled 2019-10-19: qty 1

## 2019-10-19 MED ORDER — DEXAMETHASONE SODIUM PHOSPHATE 10 MG/ML IJ SOLN
INTRAMUSCULAR | Status: DC | PRN
  Administered 2019-10-19: 10 mg via INTRAVENOUS

## 2019-10-19 MED ORDER — LIDOCAINE HCL (PF) 4 % IJ SOLN
INTRAMUSCULAR | Status: DC | PRN
  Administered 2019-10-19: 16 mg via RESPIRATORY_TRACT

## 2019-10-19 MED ORDER — ONDANSETRON HCL 4 MG/2ML IJ SOLN
INTRAMUSCULAR | Status: DC | PRN
  Administered 2019-10-19: 4 mg via INTRAVENOUS

## 2019-10-19 MED ORDER — PROPOFOL 10 MG/ML IV BOLUS
INTRAVENOUS | Status: DC | PRN
  Administered 2019-10-19: 200 mg via INTRAVENOUS
  Administered 2019-10-19: 50 mg via INTRAVENOUS

## 2019-10-19 MED ORDER — ONDANSETRON HCL 4 MG/2ML IJ SOLN: 4.0000 mg | Freq: Four times a day (QID) | INTRAMUSCULAR | Status: DC | PRN

## 2019-10-19 MED ORDER — MENTHOL 3 MG MT LOZG
1.0000 | LOZENGE | OROMUCOSAL | Status: DC | PRN
  Filled 2019-10-19: qty 9

## 2019-10-19 MED ORDER — ACETAMINOPHEN 500 MG PO TABS
1000.0000 mg | ORAL_TABLET | Freq: Four times a day (QID) | ORAL | Status: AC
  Administered 2019-10-19 – 2019-10-20 (×4): 1000 mg via ORAL
  Filled 2019-10-19 (×4): qty 2

## 2019-10-19 MED ORDER — ONDANSETRON HCL 4 MG/2ML IJ SOLN
INTRAMUSCULAR | Status: AC
  Filled 2019-10-19: qty 2

## 2019-10-19 MED ORDER — ROCURONIUM BROMIDE 50 MG/5ML IV SOLN
INTRAVENOUS | Status: AC
  Filled 2019-10-19: qty 1

## 2019-10-19 MED ORDER — HYDROMORPHONE HCL 1 MG/ML IJ SOLN
INTRAMUSCULAR | Status: AC
  Filled 2019-10-19: qty 1

## 2019-10-19 MED ORDER — FENTANYL CITRATE (PF) 100 MCG/2ML IJ SOLN
INTRAMUSCULAR | Status: DC | PRN
  Administered 2019-10-19: 100 ug via INTRAVENOUS
  Administered 2019-10-19 (×2): 50 ug via INTRAVENOUS

## 2019-10-19 SURGICAL SUPPLY — 75 items
BLADE BOVIE TIP EXT 4 (BLADE) ×2 IMPLANT
BLADE SURG 15 STRL LF DISP TIS (BLADE) ×1 IMPLANT
BLADE SURG 15 STRL SS (BLADE) ×1
BONE CANC CHIPS 20CC PCAN1/4 (Bone Implant) ×2 IMPLANT
BULB RESERV EVAC DRAIN JP 100C (MISCELLANEOUS) ×2 IMPLANT
BUR NEURO DRILL SOFT 3.0X3.8M (BURR) ×2 IMPLANT
CANISTER SUCT 1200ML W/VALVE (MISCELLANEOUS) ×4 IMPLANT
CHIPS CANC BONE 20CC PCAN1/4 (Bone Implant) ×1 IMPLANT
CHLORAPREP W/TINT 26 (MISCELLANEOUS) ×2 IMPLANT
COUNTER NEEDLE 20/40 LG (NEEDLE) ×2 IMPLANT
COVER BACK TABLE REUSABLE LG (DRAPES) ×2 IMPLANT
COVER LIGHT HANDLE STERIS (MISCELLANEOUS) ×4 IMPLANT
COVER WAND RF STERILE (DRAPES) ×2 IMPLANT
CRADLE LAMINECT ARM (MISCELLANEOUS) ×2 IMPLANT
CUP MEDICINE 2OZ PLAST GRAD ST (MISCELLANEOUS) ×4 IMPLANT
DERMABOND ADVANCED (GAUZE/BANDAGES/DRESSINGS) ×1
DERMABOND ADVANCED .7 DNX12 (GAUZE/BANDAGES/DRESSINGS) ×1 IMPLANT
DRAIN CHANNEL JP 10F RND 20C F (MISCELLANEOUS) ×2 IMPLANT
DRAPE 3/4 80X56 (DRAPES) ×2 IMPLANT
DRAPE C-ARM 42X72 X-RAY (DRAPES) ×4 IMPLANT
DRAPE INCISE IOBAN 66X45 STRL (DRAPES) IMPLANT
DRAPE LAPAROTOMY 100X77 ABD (DRAPES) ×2 IMPLANT
DRAPE MICROSCOPE SPINE 48X150 (DRAPES) IMPLANT
DRAPE POUCH INSTRU U-SHP 10X18 (DRAPES) ×2 IMPLANT
DRAPE SURG 17X11 SM STRL (DRAPES) ×8 IMPLANT
DRSG TEGADERM 4X4.75 (GAUZE/BANDAGES/DRESSINGS) ×4 IMPLANT
DRSG TELFA 4X3 1S NADH ST (GAUZE/BANDAGES/DRESSINGS) ×2 IMPLANT
ELECT CAUTERY BLADE TIP 2.5 (TIP) ×2
ELECTRODE CAUTERY BLDE TIP 2.5 (TIP) ×1 IMPLANT
FEE INTRAOP MONITOR IMPULS NCS (MISCELLANEOUS) IMPLANT
FRAME EYE SHIELD (PROTECTIVE WEAR) ×4 IMPLANT
GAUZE SPONGE 4X4 12PLY STRL (GAUZE/BANDAGES/DRESSINGS) ×2 IMPLANT
GLOVE BIOGEL PI IND STRL 7.0 (GLOVE) ×1 IMPLANT
GLOVE BIOGEL PI INDICATOR 7.0 (GLOVE) ×1
GLOVE SURG SYN 7.0 (GLOVE) ×4 IMPLANT
GLOVE SURG SYN 8.5  E (GLOVE) ×3
GLOVE SURG SYN 8.5 E (GLOVE) ×3 IMPLANT
GOWN SRG XL LVL 3 NONREINFORCE (GOWNS) ×1 IMPLANT
GOWN STRL NON-REIN TWL XL LVL3 (GOWNS) ×1
GOWN STRL REUS W/ TWL LRG LVL3 (GOWN DISPOSABLE) ×1 IMPLANT
GOWN STRL REUS W/TWL LRG LVL3 (GOWN DISPOSABLE) ×1
GOWN STRL REUS W/TWL MED LVL3 (GOWN DISPOSABLE) ×2 IMPLANT
GRADUATE 1200CC STRL 31836 (MISCELLANEOUS) ×2 IMPLANT
HEMOVAC 400CC 10FR (MISCELLANEOUS) ×2 IMPLANT
INTRAOP MONITOR FEE IMPULS NCS (MISCELLANEOUS)
INTRAOP MONITOR FEE IMPULSE (MISCELLANEOUS)
KIT INFUSE MEDIUM (Orthopedic Implant) ×2 IMPLANT
KIT TURNOVER KIT A (KITS) ×2 IMPLANT
MARKER SKIN DUAL TIP RULER LAB (MISCELLANEOUS) ×4 IMPLANT
NEEDLE HYPO 22GX1.5 SAFETY (NEEDLE) ×2 IMPLANT
NEEDLE SPNL 18GX3.5 QUINCKE PK (NEEDLE) ×6 IMPLANT
NS IRRIG 1000ML POUR BTL (IV SOLUTION) ×2 IMPLANT
PACK LAMINECTOMY NEURO (CUSTOM PROCEDURE TRAY) ×2 IMPLANT
PAD ARMBOARD 7.5X6 YLW CONV (MISCELLANEOUS) ×2 IMPLANT
PIN CASPAR 14 (PIN) ×1 IMPLANT
PIN CASPAR 14MM (PIN) ×2
PIN MAYFIELD SKULL DISP (PIN) ×2 IMPLANT
PUTTY DBM PROPEL MEDIUM (Putty) ×2 IMPLANT
ROD VUEPOINT 3.5X60 (Rod) ×4 IMPLANT
SCREW MULTI AXIAL 3.5X14MM L (Screw) ×16 IMPLANT
SCREW SET THREADED (Screw) ×16 IMPLANT
SPOGE SURGIFLO 8M (HEMOSTASIS) ×1
SPONGE SURGIFLO 8M (HEMOSTASIS) ×1 IMPLANT
STAPLER SKIN PROX 35W (STAPLE) ×4 IMPLANT
SUT ETHILON 3-0 FS-10 30 BLK (SUTURE)
SUT V-LOC 90 ABS DVC 3-0 CL (SUTURE) ×2 IMPLANT
SUT VIC AB 0 CT1 27 (SUTURE) ×4
SUT VIC AB 0 CT1 27XCR 8 STRN (SUTURE) ×4 IMPLANT
SUT VIC AB 2-0 CT1 18 (SUTURE) ×4 IMPLANT
SUTURE EHLN 3-0 FS-10 30 BLK (SUTURE) IMPLANT
SYR 30ML LL (SYRINGE) ×6 IMPLANT
TAPE CLOTH 3X10 WHT NS LF (GAUZE/BANDAGES/DRESSINGS) ×4 IMPLANT
TOWEL OR 17X26 4PK STRL BLUE (TOWEL DISPOSABLE) ×8 IMPLANT
TRAY FOLEY MTR SLVR 16FR STAT (SET/KITS/TRAYS/PACK) IMPLANT
TUBING CONNECTING 10 (TUBING) ×2 IMPLANT

## 2019-10-19 NOTE — Progress Notes (Signed)
Moving all extremities well  Hand grip good  Neck brace on   Pain level 5 out of 10 and is good with this    Transfer to floor

## 2019-10-19 NOTE — Op Note (Signed)
Indications: Mr. 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  He failed conservative management and elected for surgical intervention.  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   Preoperative Note:   Risks of surgery discussed include: infection, bleeding, stroke, coma, death, paralysis, CSF leak, nerve/spinal cord injury, numbness, tingling, weakness, complex regional pain syndrome, recurrent stenosis and/or disc herniation, vascular injury, development of instability, neck/back pain, need for further surgery, persistent symptoms, development of deformity, and the risks of anesthesia. The patient understood these risks and agreed to proceed.  Operative Note:   OPERATIVE PROCEDURE:  1. Posterior Segmental Instrumentation C4-7 using Nuvasive Vuepoint 2. Posterolateral arthrodesis from C4-7 3. Use of flouroscopy  OPERATIVE PROCEDURE:  After induction of general anesthesia, the [place] was placed. The patient was placed in the prone position on the [place] table and open frame.  The head was secured.  A midline incision was then planned using fluoroscopy.  A timeout was performed, and antibiotics given.  Next, the posterior cervical region was prepped and draped in the usual sterile fashion. The incision was injected with local anesthetic, the opened sharply. A subperiosteal dissection was then carried out to expose the remaining posterior elements from C4 and C7, with careful attention paid to maintaining the C3/4 facet capsule.  After satisfactory exposure had been obtained, our attention was turned to placement of lateral mass screws.  On each side, the high speed drill was used to remove the soft tissue of the facet from C4/5 to C6/7. Lateral mass  screws were then placed at each level using a modified Magerl technique. Briefly, a pilot hole was drilled in the lateral mass using the high-speed drill on each side.  Next, a drill was used to drill a tract in each lateral mass to 55mm. A balltip probe was used to confirm lack of breach. We then placed 3.5x 14 mm screws at C7, 3.5x 14 mm screws at C4, 3.5x 14 mm screws at C5, and 3.5x 14 mm screws at C6.  Rods were measured and shaped, then secured to the screws according to manufacturer's specifications.  After instrumentation was complete, final AP and lateral radiographs were taken.  The wound was copiously irrigated with bacitracin-containing solution and hemostasis was achieved.  Using high-speed drill, the lateral margin of the lateral masses and laminae were gently decorticated.  BMP was placed, then allograft for arthrodesis from C4 to C7.  A Hemovac drain was then placed in the wound deep to the fascia.   The wound was closed in a multilayer fashion using interrupted 0 and 2-0 Vicryl sutures.  The final skin edges were reapproximated using a 2-0 Vicryl.  Staples were placed on the wound for skin closure.  After closure, the patient was flipped supine and the Gardner-Wells tongs removed.  Patient was then handed back over to anesthesia. All counts were correct at the conclusion of the procedure.    Marin Olp PA acted as an Pensions consultant throughout the case.   Meade Maw MD

## 2019-10-19 NOTE — Progress Notes (Signed)
Procedure: C4-C7 posterior cervical spine fusion Procedure date: 10/19/2019 Diagnosis: Pseudoarthrosis   History: David Kennedy is s/p C4-C7 posterior cervical spine fusion for pseudoarthrosis.  POD0: Tolerated procedure well. Evaluated in post op recovery still disoriented from anesthesia but able to answer questions and obey commands.  Complains of posterior neck soreness.  Denies any new pain/numbness/tingling in upper or lower extremities.  Physical Exam: Vitals:   10/19/19 1456 10/19/19 1500  BP: (!) 147/128 (!) 185/106  Pulse: 85 82  Resp: (!) 24 18  Temp:    SpO2: 96% 96%   Strength:5/5 throughout upper and lower extremities bilaterally Sensation: intact and symmetric throughout upper and lower extremities bilaterally Skin: Dressing intact at incision site.  Data:  No results for input(s): NA, K, CL, CO2, BUN, CREATININE, LABGLOM, GLUCOSE, CALCIUM in the last 168 hours. No results for input(s): AST, ALT, ALKPHOS in the last 168 hours.  Invalid input(s): TBILI   No results for input(s): WBC, HGB, HCT, PLT in the last 168 hours. No results for input(s): APTT, INR in the last 168 hours.       Other tests/results: Cervical x-rays pending  Assessment/Plan:  David Kennedy is POD0 s/p C4-C7 posterior cervical spine fusion for pseudoarthrosis. Will continue to monitor  - monitor drain output - mobilize - pain control - DVT prophylaxis - PTOT - Brace - Imaging  Marin Olp PA-C Department of Neurosurgery

## 2019-10-19 NOTE — Transfer of Care (Signed)
Immediate Anesthesia Transfer of Care Note  Patient: MANFORD ECKHARDT  Procedure(s) Performed: C4-7 POSTERIOR SPINAL FUSION (N/A )  Patient Location: PACU  Anesthesia Type:General  Level of Consciousness: awake and alert   Airway & Oxygen Therapy: Patient Spontanous Breathing and Patient connected to face mask oxygen  Post-op Assessment: Report given to RN and Post -op Vital signs reviewed and stable  Post vital signs: Reviewed and stable  Last Vitals:  Vitals Value Taken Time  BP 164/95 10/19/19 1427  Temp    Pulse 78 10/19/19 1435  Resp 12 10/19/19 1435  SpO2 98 % 10/19/19 1435  Vitals shown include unvalidated device data.  Last Pain:  Vitals:   10/19/19 0924  TempSrc: Temporal  PainSc: 8          Complications: No apparent anesthesia complications

## 2019-10-19 NOTE — Progress Notes (Signed)
Blood pressure high 171/102  Continues to have a lot of pain     Hand grips good  Moving all extremities well

## 2019-10-19 NOTE — H&P (Signed)
HISTORY OF PRESENT ILLNESS: 10/19/2019 David Kennedy continues to have neck pain as noted below.  He has successfully quit smoking and is here today for surgery.   08/17/2019 David Kennedy is 12 months status post C4-C7 ACDF for cervical radiculopathy.   He has concern for neck pain after C4-7 anterior cervical discectomy and fusion. 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 He had returned to work and was doing well but then began having onset of neck pain again. 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  He had a right-sided C4-5, C5-6, C6-7 facet injection February 02, 2019 and left-sided C4-5, C5-6, C6-7 facet injections on March 07, 2019.  He had 100% relief of his neck pain after his most recent injection. He is just now having some stiffness in his neck.  He was thought to be a decent candidate for medial branch block, but was denied when insurance reviewed this. He returns today to consider posterior spinal fusion for his pseudoarthrosis. He has quit smoking, with his last cigarette on November 10.  Current Meds  Medication Sig  . dexlansoprazole (DEXILANT) 60 MG capsule Take 60 mg by mouth in the morning and at bedtime.  . fluticasone (FLONASE) 50 MCG/ACT nasal spray Place 2 sprays into both nostrils daily as needed (allergies.).   Marland Kitchen ibuprofen (ADVIL) 800 MG tablet Take 800 mg by mouth every 6 (six) hours as needed for pain.  . Multiple Vitamin (MULTIVITAMIN WITH MINERALS) TABS tablet Take 1 tablet by mouth daily.  Marland Kitchen PARoxetine (PAXIL-CR) 25 MG 24 hr tablet TAKE 1 TABLET (25 MG TOTAL) BY MOUTH DAILY. AM   Allergies  Allergen Reactions  . Atorvastatin Other (See Comments)    Headache and fatigue   . Ezetimibe Other (See Comments)    Joint pain   . Pravachol [Pravastatin] Other (See Comments)    Pt can not recall SE   Social History   Socioeconomic History  . Marital status: Widowed    Spouse name: Not on file  . Number of children: Not on  file  . Years of education: Not on file  . Highest education level: Not on file  Occupational History  . Not on file  Tobacco Use  . Smoking status: Former Smoker    Packs/day: 1.00    Years: 30.00    Pack years: 30.00    Types: Cigarettes    Quit date: 07/07/2019    Years since quitting: 0.2  . Smokeless tobacco: Never Used  Substance and Sexual Activity  . Alcohol use: Not Currently  . Drug use: No  . Sexual activity: Yes    Birth control/protection: None  Other Topics Concern  . Not on file  Social History Narrative  . Not on file   Social Determinants of Health   Financial Resource Strain:   . Difficulty of Paying Living Expenses: Not on file  Food Insecurity:   . Worried About Charity fundraiser in the Last Year: Not on file  . Ran Out of Food in the Last Year: Not on file  Transportation Needs:   . Lack of Transportation (Medical): Not on file  . Lack of Transportation (Non-Medical): Not on file  Physical Activity:   . Days of Exercise per Week: Not on file  . Minutes of Exercise per Session: Not on file  Stress:   . Feeling of Stress : Not on file  Social Connections:   . Frequency of Communication with  Friends and Family: Not on file  . Frequency of Social Gatherings with Friends and Family: Not on file  . Attends Religious Services: Not on file  . Active Member of Clubs or Organizations: Not on file  . Attends Archivist Meetings: Not on file  . Marital Status: Not on file  Intimate Partner Violence:   . Fear of Current or Ex-Partner: Not on file  . Emotionally Abused: Not on file  . Physically Abused: Not on file  . Sexually Abused: Not on file     PHYSICAL EXAMINATION:  Today's Vitals   10/19/19 0924  BP: (!) 169/96  Pulse: 77  Resp: 16  Temp: 98.1 F (36.7 C)  TempSrc: Temporal  SpO2: 96%  PainSc: 8    There is no height or weight on file to calculate BMI.   Heart sounds normal no MRG. Chest Clear to Auscultation  Bilaterally.     General: Patient is well-developed, well-nourished, and in no apparent distress  Neurological: Awake, alert, oriented to person, place, and time. Pupils equal round and reactive to light. Facial tone is symmetric. No pronator drift.   Strength: Side Biceps Triceps Deltoid Interossei Grip Wrist Ext. Wrist Flex.  R 5 5 5 5 5 5 5   L 5 5 5 5 5 5 5    Sensory: intact and symmetric to light touch in upper extremities  Skin: Incision site is healing well.  IMAGING: No complications noted. Some settling noted.  Ct C spine 01/18/2019 C4-5: Interbody and anterior screw and plate fusion with partial incorporation of the bone plug. Mild diffuse posterior disc protrusion, more focal on the right, causing possible right C5 nerve root compression.  C5-6: Interbody and anterior screw and plate fusion with incomplete incorporation of the bone plug. Mild bilateral posterior disc protrusion and spur formation with moderate foraminal stenosis on the right and mild foraminal stenosis on the left.  C6-7: Interbody and anterior screw and plate fusion with incomplete incorporation of the bone plug. Mild diffuse posterior disc protrusion and spur formation, causing moderate foraminal stenosis on the left and minimal foraminal stenosis on the right.  C7-T1: Unremarkable.  T1-2: Unremarkable.  T2-3: Unremarkable.  Upper chest: Clear lung apices.  Other: None.  IMPRESSION: 1. Postoperative and degenerative changes, as described above. 2. Possible right C5 nerve root compression at the C4-5 level.  Electronically Signed By: Claudie Revering M.D. On: 01/18/2019 14:08   Assessment / Plan: David Kennedy is still having neck pain approximately 12 months status post C4-C7 ACDF. 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 He has had diagnostic and therapeutic facet injections which helped him significantly, but did not last. He has radiographic evidence of pseudoarthrosis at  all levels.  We will proceed today with C4-7 posterior fusion.   Meade Maw MD, [facility] Department of Neurosurgery

## 2019-10-19 NOTE — Anesthesia Procedure Notes (Signed)
Procedure Name: Intubation Date/Time: 10/19/2019 11:45 AM Performed by: Allean Found, CRNA Pre-anesthesia Checklist: Patient identified, Patient being monitored, Timeout performed, Emergency Drugs available and Suction available Patient Re-evaluated:Patient Re-evaluated prior to induction Oxygen Delivery Method: Circle system utilized Preoxygenation: Pre-oxygenation with 100% oxygen Induction Type: IV induction Ventilation: Mask ventilation without difficulty Laryngoscope Size: Mac, 3 and McGraph Grade View: Grade I Tube type: Oral Tube size: 7.5 mm Number of attempts: 1 Airway Equipment and Method: Stylet and Video-laryngoscopy Placement Confirmation: ETT inserted through vocal cords under direct vision,  positive ETCO2 and breath sounds checked- equal and bilateral Secured at: 23 cm Tube secured with: Tape Dental Injury: Teeth and Oropharynx as per pre-operative assessment

## 2019-10-19 NOTE — Consult Note (Signed)
Pharmacy Antibiotic Note  David Kennedy is a 54 y.o. male admitted on 10/19/2019 for planned surgical procedure.  Pharmacy has been consulted for Cefazolin pre-op dosing.  Plan: Cefazolin 2g IV x 1 dose 30-min pre-op ordered    Temp (24hrs), Avg:98.1 F (36.7 C), Min:98.1 F (36.7 C), Max:98.1 F (36.7 C)  No results for input(s): WBC, CREATININE, LATICACIDVEN, VANCOTROUGH, VANCOPEAK, VANCORANDOM, GENTTROUGH, GENTPEAK, GENTRANDOM, TOBRATROUGH, TOBRAPEAK, TOBRARND, AMIKACINPEAK, AMIKACINTROU, AMIKACIN in the last 168 hours.  CrCl cannot be calculated (Patient's most recent lab result is older than the maximum 21 days allowed.).    Allergies  Allergen Reactions  . Atorvastatin Other (See Comments)    Headache and fatigue   . Ezetimibe Other (See Comments)    Joint pain   . Pravachol [Pravastatin] Other (See Comments)    Pt can not recall SE    Thank you for allowing pharmacy to be a part of this patient's care.  Pernell Dupre, PharmD, BCPS Clinical Pharmacist 10/19/2019 9:49 AM

## 2019-10-19 NOTE — Anesthesia Preprocedure Evaluation (Signed)
Anesthesia Evaluation  Patient identified by MRN, date of birth, ID band Patient awake    Reviewed: Allergy & Precautions, H&P , NPO status , Patient's Chart, lab work & pertinent test results, reviewed documented beta blocker date and time   Airway Mallampati: III  TM Distance: >3 FB Neck ROM: full    Dental  (+) Teeth Intact   Pulmonary neg pulmonary ROS, former smoker,    Pulmonary exam normal        Cardiovascular Exercise Tolerance: Good negative cardio ROS Normal cardiovascular exam Rhythm:regular Rate:Normal     Neuro/Psych PSYCHIATRIC DISORDERS Anxiety  Neuromuscular disease    GI/Hepatic Neg liver ROS, GERD  Medicated,  Endo/Other  negative endocrine ROS  Renal/GU negative Renal ROS  negative genitourinary   Musculoskeletal   Abdominal   Peds  Hematology negative hematology ROS (+)   Anesthesia Other Findings Past Medical History: No date: Anxiety No date: Arthritis     Comment:  osteo - hips No date: Bone spur 07/21/2018: Cervical radiculopathy No date: GERD (gastroesophageal reflux disease) No date: Herniated intervertebral disc of lumbar spine No date: Sinusitis No date: Wears dentures     Comment:  partial upper and lower Past Surgical History: 07/21/2018: ANTERIOR CERVICAL DECOMP/DISCECTOMY FUSION; N/A     Comment:  Procedure: ANTERIOR CERVICAL DECOMPRESSION/DISCECTOMY               FUSION 3 LEVELS-C4-7;  Surgeon: Meade Maw, MD;                Location: [facility] ORS;  Service: Neurosurgery;  Laterality:               N/A; 02/26/2018: COLONOSCOPY WITH PROPOFOL; N/A     Comment:  Procedure: COLONOSCOPY WITH PROPOFOL;  Surgeon: [name], MD;  Location: [facility] ENDOSCOPY;  Service:               Gastroenterology;  Laterality: N/A; 02/26/2018: ESOPHAGOGASTRODUODENOSCOPY (EGD) WITH PROPOFOL; N/A     Comment:  Positive for Barrett's Esophagus. REPEAT 03/2021 08/08/2015:  ETHMOIDECTOMY; Bilateral     Comment:  Procedure: TOTAL ETHMOIDECTOMY;  Surgeon: Carloyn Manner, MD;  Location: [place];  Service:               ENT;  Laterality: Bilateral; 08/08/2015: FRONTAL SINUS EXPLORATION; Left     Comment:  Procedure: FRONTAL SINUS EXPLORATION;  Surgeon:               Carloyn Manner, MD;  Location: [place];                Service: ENT;  Laterality: Left; 08/08/2015: IMAGE GUIDED SINUS SURGERY; N/A     Comment:  Procedure: IMAGE GUIDED SINUS SURGERY;  Surgeon:               Carloyn Manner, MD;  Location: [place];                Service: ENT;  Laterality: N/A;  GAVE DISK TO CECE 12/8 08/08/2015: MAXILLARY ANTROSTOMY; Bilateral     Comment:  Procedure: MAXILLARY ANTROSTOMY;  Surgeon: Carloyn Manner, MD;  Location: [place];  Service:               ENT;  Laterality:  Bilateral; No date: MICROLARYNGOSCOPY     Comment:  with biopsy 08/08/2015: NASAL TURBINATE REDUCTION; Bilateral     Comment:  Procedure: TURBINATE REDUCTION/SUBMUCOSAL RESECTION;                Surgeon: Carloyn Manner, MD;  Location: [place];  Service: ENT;  Laterality: Bilateral; 08/08/2015: SEPTOPLASTY; N/A     Comment:  Procedure: SEPTOPLASTY;  Surgeon: Carloyn Manner, MD;               Location: [place];  Service: ENT;                Laterality: N/A; 08/08/2015: SPHENOIDECTOMY; Left     Comment:  Procedure: SPHENOIDECTOMY;  Surgeon: Carloyn Manner,               MD;  Location: [place];  Service: ENT;                Laterality: Left; 2013: UPPER GASTROINTESTINAL ENDOSCOPY No date: WISDOM TOOTH EXTRACTION   Reproductive/Obstetrics negative OB ROS                             Anesthesia Physical Anesthesia Plan  ASA: II  Anesthesia Plan: General ETT   Post-op Pain Management:    Induction:   PONV Risk Score and Plan:  3  Airway Management Planned:   Additional Equipment:   Intra-op Plan:   Post-operative Plan:   Informed Consent: I have reviewed the patients History and Physical, chart, labs and discussed the procedure including the risks, benefits and alternatives for the proposed anesthesia with the patient or authorized representative who has indicated his/her understanding and acceptance.     Dental Advisory Given  Plan Discussed with: CRNA  Anesthesia Plan Comments:         Anesthesia Quick Evaluation

## 2019-10-19 NOTE — OR Nursing (Signed)
This RN spoke with Dr. Andree Elk about repeat labs if needed. No new orders given at this time. Dr. Andree Elk will be at bedside to assess.

## 2019-10-20 ENCOUNTER — Inpatient Hospital Stay: Payer: BC Managed Care – PPO

## 2019-10-20 DIAGNOSIS — M4322 Fusion of spine, cervical region: Secondary | ICD-10-CM | POA: Diagnosis not present

## 2019-10-20 MED ORDER — KETOROLAC TROMETHAMINE 30 MG/ML IJ SOLN
30.0000 mg | Freq: Once | INTRAMUSCULAR | Status: AC
  Administered 2019-10-20: 30 mg via INTRAVENOUS
  Filled 2019-10-20: qty 1

## 2019-10-20 MED ORDER — KETOROLAC TROMETHAMINE 15 MG/ML IJ SOLN
15.0000 mg | Freq: Four times a day (QID) | INTRAMUSCULAR | Status: DC
  Administered 2019-10-20 – 2019-10-21 (×2): 15 mg via INTRAVENOUS
  Filled 2019-10-20 (×2): qty 1

## 2019-10-20 NOTE — Anesthesia Postprocedure Evaluation (Signed)
Anesthesia Post Note  Patient: David Kennedy  Procedure(s) Performed: C4-7 POSTERIOR SPINAL FUSION (N/A )  Patient location during evaluation: PACU Anesthesia Type: General Level of consciousness: awake and alert Pain management: pain level controlled Vital Signs Assessment: post-procedure vital signs reviewed and stable Respiratory status: spontaneous breathing, nonlabored ventilation, respiratory function stable and patient connected to nasal cannula oxygen Cardiovascular status: blood pressure returned to baseline and stable Postop Assessment: no apparent nausea or vomiting Anesthetic complications: no     Last Vitals:  Vitals:   10/19/19 2332 10/20/19 0339  BP: (!) 156/88 (!) 153/87  Pulse: 73 65  Resp: 16 17  Temp: (!) 36.4 C (!) 36.4 C  SpO2: 94% 92%    Last Pain:  Vitals:   10/20/19 0551  TempSrc:   PainSc: 6                  Arita Miss

## 2019-10-20 NOTE — Evaluation (Signed)
Physical Therapy Evaluation Patient Details Name: David Kennedy MRN: [id number] DOB: [birth year] Today's Date: 10/20/2019   History of Present Illness  54 y/o male s/p C4-C7 posterior cervical spine fusion for pseudoarthrosis, has history of ACDF for related issues.  Clinical Impression  Pt did well with PT, showed good safety and confidence with prolonged walk w/o AD and overall feels good about going home (very much wants to today).  He is generally aware of neck precautions and seems to respect that he needs to let the neck heal before getting back to his normally active lifestyle.  Pt with no LOBs, increased pain or overt safety concerns t/o PT exam.  Pt safe for d/c from PT stand-point.    Follow Up Recommendations Follow surgeon's recommendation for DC plan and follow-up therapies    Equipment Recommendations  None recommended by PT    Recommendations for Other Services       Precautions / Restrictions Precautions Precautions: Cervical Precaution Comments: No bending, arching or twisting Required Braces or Orthoses: Cervical Brace Cervical Brace: Other (comment)(Patient may removed brace while in bed, showering, or ambulating to restroom.) Restrictions Weight Bearing Restrictions: No Other Position/Activity Restrictions: May don/doff cervical brace in sitting.      Mobility  Bed Mobility Overal bed mobility: Independent                Transfers Overall transfer level: Independent Equipment used: None Transfers: Sit to/from Stand Sit to Stand: Modified independent (Device/Increase time) Stand pivot transfers: Supervision       General transfer comment: Pt able to rise to standing w/o hesitantion or safety concerns  Ambulation/Gait Ambulation/Gait assistance: Modified independent (Device/Increase time) Gait Distance (Feet): 350 Feet Assistive device: None       General Gait Details: Pt able to circumambulate the nurses' station X2 w/o hesitation,  safety concerns, increased pain or excessive fatigue.  Stairs            Wheelchair Mobility    Modified Rankin (Stroke Patients Only)       Balance Overall balance assessment: Independent                                           Pertinent Vitals/Pain Pain Assessment: 0-10 Pain Score: 5  Pain Location: surgical site Pain Descriptors / Indicators: Sore Pain Intervention(s): Limited activity within patient's tolerance;Monitored during session    Home Living Family/patient expects to be discharged to:: Private residence Living Arrangements: Spouse/significant other Available Help at Discharge: Family Type of Home: House Home Access: Level entry     Home Layout: One level Home Equipment: None Additional Comments: Pt reports girlfriend is available 24/7 if needed    Prior Function Level of Independence: Independent         Comments: per pt, I with all (I/B)ADLs, functional mobility and community activities. Pt very active, hangs drywall, etc     Hand Dominance   Dominant Hand: Right    Extremity/Trunk Assessment   Upper Extremity Assessment Upper Extremity Assessment: Overall WFL for tasks assessed(deferred resisted overhead tasks, etc 2/2 sx/brace)    Lower Extremity Assessment Lower Extremity Assessment: Overall WFL for tasks assessed    Cervical / Trunk Assessment Cervical / Trunk Assessment: Normal  Communication   Communication: No difficulties  Cognition Arousal/Alertness: Awake/alert Behavior During Therapy: WFL for tasks assessed/performed Overall Cognitive Status: Within Functional Limits for tasks assessed  General Comments: A&O x4. Pleasant.      General Comments General comments (skin integrity, edema, etc.): Demonstrates good ability to pace self and is aware of all precautions/able to maintain them during functional activity.  No numbness/dec sensation, coordination or  other related neurological signs.    Exercises Other Exercises Other Exercises: General education on safety and precautions, pt with h/o neck surgery and states he is familiar and comfortable with these   Assessment/Plan    PT Assessment Patient needs continued PT services  PT Problem List Decreased activity tolerance;Decreased coordination;Decreased mobility;Decreased balance;Decreased safety awareness       PT Treatment Interventions Gait training;Functional mobility training;Therapeutic activities;Therapeutic exercise;Balance training;Neuromuscular re-education;Patient/family education    PT Goals (Current goals can be found in the Care Plan section)  Acute Rehab PT Goals Patient Stated Goal: "feel better and get home" PT Goal Formulation: With patient Time For Goal Achievement: 11/03/19 Potential to Achieve Goals: Good    Frequency 7X/week   Barriers to discharge        Co-evaluation               AM-PAC PT "6 Clicks" Mobility  Outcome Measure Help needed turning from your back to your side while in a flat bed without using bedrails?: None Help needed moving from lying on your back to sitting on the side of a flat bed without using bedrails?: None Help needed moving to and from a bed to a chair (including a wheelchair)?: None Help needed standing up from a chair using your arms (e.g., wheelchair or bedside chair)?: None Help needed to walk in hospital room?: None Help needed climbing 3-5 steps with a railing? : None 6 Click Score: 24    End of Session Equipment Utilized During Treatment: Gait belt Activity Tolerance: Patient tolerated treatment well   Nurse Communication: Mobility status PT Visit Diagnosis: Pain Pain - part of body: David Kennedy)    [id number] PT Time Calculation (min) (ACUTE ONLY): 16 min   Charges:   PT Evaluation $PT Eval Low Complexity: 1 Low          David Kennedy, DPT 10/20/2019, 12:12 PM

## 2019-10-20 NOTE — Evaluation (Signed)
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   Clinical Impression   Patient seen this date for OT evaluation s/p surgery.  Patient pleasant and cooperative, verbalizing  5/10 pain at rest at surgical site.  Provided education on goals of OT within acute setting as applicable to patient.  Pt verbalized understanding.  Patient performed functional mobility to restroom without use of DME at [facility] level.  Demonstrates good ability to pace self and safety awareness.  Completed all toileting tasks with SPV and no use of DME.  Completed grooming while standing at sink at SPV.  Patient able to recall all cervical precautions without prompting or cues.  Patient able to verbalize donning/doffing cervical brace without cues.  Provided further education concerning: cervical precautions, donning/doffing brace and wear time, and use of compensatory techniques to perform daily tasks safely.  Patient demonstrated and verbalized understanding of all education. Demonstrating good ability to perform ADLs safely.  Recommending evaluation only at this time and no follow up OT upon discharge.    Follow Up Recommendations  No OT follow up    Equipment Recommendations  None recommended by OT    Recommendations for Other Services       Precautions / Restrictions Precautions Precautions: Cervical Precaution Comments: No bending, arching or twisting Required Braces or Orthoses: Cervical Brace Cervical Brace: Other (comment)(Patient may removed brace while in bed, showering, or ambulating to restroom.) Restrictions Weight Bearing Restrictions: No Other Position/Activity Restrictions: May don/doff cervical brace in sitting.      Mobility Bed Mobility                  Transfers Overall transfer level: Needs  assistance   Transfers: Sit to/from Stand;Stand Pivot Transfers Sit to Stand: Supervision Stand pivot transfers: Supervision       General transfer comment: No equipment need. SPV recommended at this time due to pain.    Balance Overall balance assessment: No apparent balance deficits (not formally assessed)                                         ADL either performed or assessed with clinical judgement   ADL Overall ADL's : Needs assistance/impaired     Grooming: Wash/dry hands;Wash/dry face;Oral care;Applying deodorant;Supervision/safety;Standing Grooming Details (indicate cue type and reason): SPV at sink         Upper Body Dressing : Modified independent;Sitting Upper Body Dressing Details (indicate cue type and reason): Patient declined doffing brace at this time, but demonstrated good knowledge of latches and how to secure effectively Lower Body Dressing: Supervision/safety;Sit to/from stand Lower Body Dressing Details (indicate cue type and reason): Able to safely utilize compensatory techniques and adhere to precautions. Toilet Transfer: Copy Details (indicate cue type and reason): far SPV without use of DME Toileting- Clothing Manipulation and Hygiene: Supervision/safety;Sit to/from stand     Tub/Shower Transfer Details (indicate cue type and reason): Able to verbally discuss how he safely enters his tub shower at home. Functional mobility during ADLs: Supervision/safety General ADL Comments: Demonstrates good safety and knowledge of self pacing and EC techniques.  Requires no DME assistance for functional transfers and mobility.     Vision Patient Visual Report: No change from baseline  Perception     Praxis      Pertinent Vitals/Pain Pain Assessment: 0-10 Pain Score: 5  Pain Location: surgical site Pain Descriptors / Indicators: Sore Pain Intervention(s): Limited activity within patient's  tolerance;Monitored during session     Hand Dominance Right   Extremity/Trunk Assessment Upper Extremity Assessment Upper Extremity Assessment: Overall WFL for tasks assessed   Lower Extremity Assessment Lower Extremity Assessment: Defer to PT evaluation   Cervical / Trunk Assessment Cervical / Trunk Assessment: Normal   Communication Communication Communication: No difficulties   Cognition Arousal/Alertness: Awake/alert Behavior During Therapy: WFL for tasks assessed/performed Overall Cognitive Status: Within Functional Limits for tasks assessed                                 General Comments: A&O x4. Pleasant.   General Comments  Demonstrates good ability to pace self and is aware of all precautions/able to maintain them during functional activity.    Exercises Other Exercises Other Exercises: General education on safety and use of call bell for assistance Other Exercises: Education on goals of OT in acute care setting Other Exercises: Education on donning/doffing cervical brace (technique and wear time) Other Exercises: Education on cervical precautions Other Exercises: Education on use of compensatory techniques to perform ADLs while maintaining precautions   Shoulder Instructions      Home Living Family/patient expects to be discharged to:: Private residence Living Arrangements: Spouse/significant other Available Help at Discharge: Family Type of Home: House Home Access: Level entry     Home Layout: One level     Bathroom Shower/Tub: Teacher, early years/pre: Standard     Home Equipment: None   Additional Comments: Patient able to verbally walk through how he steps in and out of tub safely.      Prior Functioning/Environment Level of Independence: Independent        Comments: per pt, I with all (I/B)ADLs, functional mobility and community activities.        OT Problem List: Decreased activity tolerance      OT  Treatment/Interventions:      OT Goals(Current goals can be found in the care plan section) Acute Rehab OT Goals Patient Stated Goal: "feel better and get home" OT Goal Formulation: With patient Time For Goal Achievement: 11/03/19 Potential to Achieve Goals: Good  OT Frequency:     Barriers to D/C:            Co-evaluation              AM-PAC OT "6 Clicks" Daily Activity     Outcome Measure Help from another person eating meals?: None Help from another person taking care of personal grooming?: None Help from another person toileting, which includes using toliet, bedpan, or urinal?: A Little Help from another person bathing (including washing, rinsing, drying)?: A Little Help from another person to put on and taking off regular upper body clothing?: None Help from another person to put on and taking off regular lower body clothing?: A Little 6 Click Score: 21   End of Session    Activity Tolerance: Patient tolerated treatment well;No increased pain Patient left: in chair;with call bell/phone within reach;with chair alarm set  OT Visit Diagnosis: Other (comment)(posterior cervical spine fusion C4-C7)                Time: [id number] OT Time Calculation (min): 22 min Charges:  OT General Charges $OT  Visit: 1 Visit OT Evaluation $OT Eval Low Complexity: 1 Low OT Treatments $Self Care/Home Management : 8-22 mins  Baldomero Lamy, MS, OTR/L 10/20/19, 11:53 AM

## 2019-10-20 NOTE — Progress Notes (Signed)
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  POD1: He is recovering well. Complains of posterior neck pain 7/10. Voiding without issue. Overnight drain output 135.    POD0: Tolerated procedure well. Evaluated in post op recovery still disoriented from anesthesia but able to answer questions and obey commands.  Complains of posterior neck soreness.  Denies any new pain/numbness/tingling in upper or lower extremities.  Physical Exam: Vitals:   10/20/19 0339 10/20/19 0817  BP: (!) 153/87 (!) 148/98  Pulse: 65 63  Resp: 17 18  Temp: (!) 97.5 F (36.4 C) 97.7 F (36.5 C)  SpO2: 92% 96%   Strength:5/5 throughout upper and lower extremities bilaterally Sensation: intact and symmetric throughout upper and lower extremities bilaterally Skin: Dressing intact at incision site.  Data:  No results for input(s): NA, K, CL, CO2, BUN, CREATININE, LABGLOM, GLUCOSE, CALCIUM in the last 168 hours. No results for input(s): AST, ALT, ALKPHOS in the last 168 hours.  Invalid input(s): TBILI   No results for input(s): WBC, HGB, HCT, PLT in the last 168 hours. No results for input(s): APTT, INR in the last 168 hours.       Other tests/results: Cervical xrays completed.  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 Will continue to monitor  - monitor drain output - 135 overnight POD0 - mobilize - pain control - add toradol  - DVT prophylaxis -SCD - PTOT - Brace - received - Imaging - completed  Marin Olp PA-C Department of Neurosurgery

## 2019-10-21 MED ORDER — METHOCARBAMOL 500 MG PO TABS: 500.0000 mg | ORAL_TABLET | Freq: Four times a day (QID) | ORAL | 0 refills | Status: AC | PRN

## 2019-10-21 MED ORDER — CELECOXIB 100 MG PO CAPS: 100.0000 mg | ORAL_CAPSULE | Freq: Two times a day (BID) | ORAL | 0 refills | Status: AC

## 2019-10-21 MED ORDER — OXYCODONE HCL 5 MG PO TABS: 5.0000 mg | ORAL_TABLET | ORAL | 0 refills | Status: AC | PRN

## 2019-10-21 NOTE — Discharge Summary (Signed)
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  POD2: Continues to recover well.  Pain currently rated 6-7/10 in the posterior aspect of cervical spine.  Denies any new upper or lower extremity pain/numbness/tingling/weakness.  He has been able to eat, ambulate, and void without issue.  Drain output overnight 75.  POD1: He is recovering well. Complains of posterior neck pain 7/10. Voiding without issue. Overnight drain output 135.    POD0: Tolerated procedure well. Evaluated in post op recovery still disoriented from anesthesia but able to answer questions and obey commands.  Complains of posterior neck soreness.  Denies any new pain/numbness/tingling in upper or lower extremities.  Physical Exam: Vitals:   10/21/19 0044 10/21/19 0726  BP: (!) 153/92 (!) 150/89  Pulse: 68 69  Resp: 17   Temp: (!) 97.4 F (36.3 C) 98.1 F (36.7 C)  SpO2: 98% 97%   Strength:5/5 throughout upper and lower extremities bilaterally Sensation: intact and symmetric throughout upper and lower extremities bilaterally Skin: Dressing intact at incision site.  Data:  No results for input(s): NA, K, CL, CO2, BUN, CREATININE, LABGLOM, GLUCOSE, CALCIUM in the last 168 hours. No results for input(s): AST, ALT, ALKPHOS in the last 168 hours.  Invalid input(s): TBILI   No results for input(s): WBC, HGB, HCT, PLT in the last 168 hours. No results for input(s): APTT, INR in the last 168 hours.       Other tests/results: EXAM: CERVICAL SPINE - 2-3 VIEW 10/20/2019 COMPARISON:  MRI 01/26/2019  FINDINGS: Posterior and anterior fusion changes from C4-C7. No hardware complicating feature. Degenerative disc disease at C3-4. Diffuse degenerative facet disease. Prevertebral soft tissues are normal.  IMPRESSION: Anterior and posterior fusion changes C4-C7. No  complicating feature.   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  Drain pulled without issue.  Staples intact at incision site.  Entire area was redressed.  We will continue postop pain control with muscle relaxer, Tylenol, pain medication, and Celebrex as.  He is scheduled to follow-up in clinic in approximately 2 weeks to monitor progress.  Advised to contact office if any questions or concerns arise before then.  Marin Olp PA-C Department of Neurosurgery

## 2019-10-21 NOTE — Progress Notes (Signed)
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    PT Comments    Pt up in bathroom upon arrival.  Walking on his own in the room.  Able to complete 2 laps around unit x 2 with no difficulties.  No further questions or concerns.    Follow Up Recommendations  Follow surgeon's recommendation for DC plan and follow-up therapies     Equipment Recommendations  None recommended by PT    Recommendations for Other Services       Precautions / Restrictions Precautions Precautions: Cervical Precaution Comments: No bending, arching or twisting Required Braces or Orthoses: Cervical Brace Restrictions Weight Bearing Restrictions: No Other Position/Activity Restrictions: May don/doff cervical brace in sitting.    Mobility  Bed Mobility Overal bed mobility: Modified Independent                Transfers Overall transfer level: Independent     Sit to Stand: Modified independent (Device/Increase time)            Ambulation/Gait Ambulation/Gait assistance: Modified independent (Device/Increase time) Gait Distance (Feet): 360 Feet Assistive device: None   Gait velocity: decreased but steady   General Gait Details: Pt able to circumambulate the nurses' station X2 w/o hesitation, safety concerns, increased pain or excessive fatigue.   Stairs             Wheelchair Mobility    Modified Rankin (Stroke Patients Only)       Balance Overall balance assessment: Independent                                          Cognition Arousal/Alertness: Awake/alert Behavior During Therapy: WFL for tasks assessed/performed Overall Cognitive Status: Within Functional Limits for tasks assessed                                  General Comments: A&O x4. Pleasant.      Exercises      General Comments        Pertinent Vitals/Pain Pain Assessment: 0-10 Pain Score: 8  Pain Location: surgical site Pain Descriptors / Indicators: Aching;Sore Pain Intervention(s): Premedicated before session    Home Living                      Prior Function            PT Goals (current goals can now be found in the care plan section) Progress towards PT goals: Progressing toward goals    Frequency    7X/week      PT Plan Current plan remains appropriate    Co-evaluation              AM-PAC PT "6 Clicks" Mobility   Outcome Measure  Help needed turning from your back to your side while in a flat bed without using bedrails?: None Help needed moving from lying on your back to sitting on the side of a flat bed without using bedrails?: None Help needed moving to and from a bed to a chair (including a wheelchair)?: None Help needed standing up from a chair using your arms (e.g.,  wheelchair or bedside chair)?: None Help needed to walk in hospital room?: None Help needed climbing 3-5 steps with a railing? : None 6 Click Score: 24    End of Session Equipment Utilized During Treatment: Gait belt Activity Tolerance: Patient tolerated treatment well Patient left: in chair;with call bell/phone within reach Nurse Communication: Mobility status       Time: [id number] PT Time Calculation (min) (ACUTE ONLY): 8 min  Charges:  $Gait Training: 8-22 mins                    Chesley Noon, PTA 10/21/19, 10:18 AM

## 2019-11-24 DIAGNOSIS — Z981 Arthrodesis status: Secondary | ICD-10-CM | POA: Diagnosis not present

## 2019-11-24 DIAGNOSIS — M47812 Spondylosis without myelopathy or radiculopathy, cervical region: Secondary | ICD-10-CM | POA: Diagnosis not present

## 2019-12-09 ENCOUNTER — Other Ambulatory Visit: Payer: Self-pay | Admitting: Internal Medicine

## 2019-12-09 DIAGNOSIS — K2100 Gastro-esophageal reflux disease with esophagitis, without bleeding: Secondary | ICD-10-CM

## 2019-12-09 NOTE — Telephone Encounter (Signed)
Requested medication (s) are due for refill today - discontinued Rx  Requested medication (s) are on the active medication list -no  Future visit scheduled -yes  Last refill: 09/15/19  Notes to clinic: Request for medication not on current list  Requested Prescriptions  Pending Prescriptions Disp Refills   pantoprazole (PROTONIX) 40 MG tablet [Pharmacy Med Name: PANTOPRAZOLE SOD DR 40 MG TAB] 90 tablet 1    Sig: TAKE 1 TABLET BY MOUTH EVERY DAY      Gastroenterology: Proton Pump Inhibitors Passed - 12/09/2019 12:05 PM      Passed - Valid encounter within last 12 months    Recent Outpatient Visits           11 months ago Annual physical exam   [facility] Glean Hess, MD   1 year ago Influenza-like illness   [facility] Glean Hess, MD   1 year ago Annual physical exam   [facility] Glean Hess, MD   2 years ago 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   [facility] Glean Hess, MD   2 years ago Dyslipidemia   [place], [place]       Future Appointments             In 2 weeks Army Melia Jesse Sans, MD [facility], [facility]                Requested Prescriptions  Pending Prescriptions Disp Refills   pantoprazole ([place]) 40 MG tablet [Pharmacy Med Name: PANTOPRAZOLE SOD DR 40 MG TAB] 90 tablet 1    Sig: TAKE 1 TABLET BY MOUTH EVERY DAY      Gastroenterology: Proton Pump Inhibitors Passed - 12/09/2019 12:05 PM      Passed - Valid encounter within last 12 months    Recent Outpatient Visits           11 months ago Annual physical exam   [facility] Glean Hess, MD   1 year ago Influenza-like illness   [facility] Glean Hess, MD   1 year ago Annual physical exam   [facility] Glean Hess, MD   2 years ago 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

## 2019-12-23 ENCOUNTER — Encounter: Payer: BLUE CROSS/BLUE SHIELD | Admitting: Internal Medicine

## 2020-01-02 DIAGNOSIS — M7551 Bursitis of right shoulder: Secondary | ICD-10-CM | POA: Diagnosis not present

## 2020-01-02 DIAGNOSIS — M7541 Impingement syndrome of right shoulder: Secondary | ICD-10-CM | POA: Diagnosis not present

## 2020-01-02 DIAGNOSIS — M25511 Pain in right shoulder: Secondary | ICD-10-CM | POA: Diagnosis not present

## 2020-01-05 DIAGNOSIS — Z981 Arthrodesis status: Secondary | ICD-10-CM | POA: Diagnosis not present

## 2020-01-05 DIAGNOSIS — M4322 Fusion of spine, cervical region: Secondary | ICD-10-CM | POA: Diagnosis not present

## 2020-01-05 DIAGNOSIS — M47812 Spondylosis without myelopathy or radiculopathy, cervical region: Secondary | ICD-10-CM | POA: Diagnosis not present

## 2020-01-29 DIAGNOSIS — M545 Low back pain: Secondary | ICD-10-CM | POA: Diagnosis not present

## 2020-01-31 ENCOUNTER — Other Ambulatory Visit: Payer: Self-pay | Admitting: Internal Medicine

## 2020-01-31 DIAGNOSIS — F411 Generalized anxiety disorder: Secondary | ICD-10-CM

## 2020-01-31 NOTE — Telephone Encounter (Signed)
Requested medications are due for refill today?  Yes  Requested medications are on active medication list?  Yes  Last Refill:  08/03/2019  # 90 with one refill  Future visit scheduled?  No  Notes to Clinic:  Medication failed RX refill protocol due to no valid encounter in the past 6 months.  Last visit was on 12/21/2018.

## 2020-01-31 NOTE — Telephone Encounter (Signed)
Please Advise.  KP

## 2020-02-06 DIAGNOSIS — G8929 Other chronic pain: Secondary | ICD-10-CM | POA: Diagnosis not present

## 2020-02-06 DIAGNOSIS — M25511 Pain in right shoulder: Secondary | ICD-10-CM | POA: Diagnosis not present

## 2020-02-13 ENCOUNTER — Other Ambulatory Visit: Payer: Self-pay | Admitting: Neurosurgery

## 2020-02-13 DIAGNOSIS — Z981 Arthrodesis status: Secondary | ICD-10-CM

## 2020-02-13 DIAGNOSIS — M5412 Radiculopathy, cervical region: Secondary | ICD-10-CM

## 2020-02-13 DIAGNOSIS — M542 Cervicalgia: Secondary | ICD-10-CM

## 2020-02-15 ENCOUNTER — Other Ambulatory Visit: Payer: BC Managed Care – PPO

## 2020-02-15 DIAGNOSIS — M25511 Pain in right shoulder: Secondary | ICD-10-CM | POA: Diagnosis not present

## 2020-02-15 DIAGNOSIS — M5412 Radiculopathy, cervical region: Secondary | ICD-10-CM | POA: Diagnosis not present

## 2020-02-15 DIAGNOSIS — M4313 Spondylolisthesis, cervicothoracic region: Secondary | ICD-10-CM | POA: Diagnosis not present

## 2020-02-15 DIAGNOSIS — M25512 Pain in left shoulder: Secondary | ICD-10-CM | POA: Diagnosis not present

## 2020-02-15 DIAGNOSIS — Z981 Arthrodesis status: Secondary | ICD-10-CM | POA: Diagnosis not present

## 2020-02-15 DIAGNOSIS — M4802 Spinal stenosis, cervical region: Secondary | ICD-10-CM | POA: Diagnosis not present

## 2020-02-15 DIAGNOSIS — M542 Cervicalgia: Secondary | ICD-10-CM | POA: Diagnosis not present

## 2020-03-06 DIAGNOSIS — M25511 Pain in right shoulder: Secondary | ICD-10-CM | POA: Diagnosis not present

## 2020-03-06 DIAGNOSIS — M7541 Impingement syndrome of right shoulder: Secondary | ICD-10-CM | POA: Diagnosis not present

## 2020-03-06 DIAGNOSIS — M7551 Bursitis of right shoulder: Secondary | ICD-10-CM | POA: Diagnosis not present

## 2020-03-07 ENCOUNTER — Other Ambulatory Visit: Payer: Self-pay | Admitting: Sports Medicine

## 2020-03-08 DIAGNOSIS — M5030 Other cervical disc degeneration, unspecified cervical region: Secondary | ICD-10-CM | POA: Diagnosis not present

## 2020-03-08 DIAGNOSIS — Z6835 Body mass index (BMI) 35.0-35.9, adult: Secondary | ICD-10-CM | POA: Diagnosis not present

## 2020-03-08 DIAGNOSIS — Z981 Arthrodesis status: Secondary | ICD-10-CM | POA: Diagnosis not present

## 2020-03-12 ENCOUNTER — Other Ambulatory Visit: Payer: Self-pay | Admitting: Internal Medicine

## 2020-03-12 DIAGNOSIS — F411 Generalized anxiety disorder: Secondary | ICD-10-CM

## 2020-03-12 NOTE — Telephone Encounter (Signed)
Requested medications are due for refill today?  Yes  Requested medications are on active medication list?  Yes  Last Refill:  08/03/2019    # 90 with one refill.    Future visit scheduled?  No   Notes to Clinic:  Medication failed Rx refill protocol due to no valid encounter in the past 6 months.

## 2020-03-13 NOTE — Telephone Encounter (Signed)
Patient needs an appointment for further refills.  CM

## 2020-03-26 ENCOUNTER — Other Ambulatory Visit: Payer: Self-pay

## 2020-03-26 ENCOUNTER — Ambulatory Visit
Admission: RE | Admit: 2020-03-26 | Discharge: 2020-03-26 | Disposition: A | Discharge status: Home / Self Care | Payer: BC Managed Care – PPO | Source: Ambulatory Visit | Attending: Sports Medicine | Admitting: Sports Medicine

## 2020-03-26 DIAGNOSIS — M25511 Pain in right shoulder: Secondary | ICD-10-CM | POA: Diagnosis not present

## 2020-04-02 DIAGNOSIS — Z981 Arthrodesis status: Secondary | ICD-10-CM | POA: Diagnosis not present

## 2020-04-02 DIAGNOSIS — Z6836 Body mass index (BMI) 36.0-36.9, adult: Secondary | ICD-10-CM | POA: Diagnosis not present

## 2020-04-04 DIAGNOSIS — M75121 Complete rotator cuff tear or rupture of right shoulder, not specified as traumatic: Secondary | ICD-10-CM | POA: Diagnosis not present

## 2020-04-05 ENCOUNTER — Other Ambulatory Visit: Payer: Self-pay | Admitting: Internal Medicine

## 2020-04-05 DIAGNOSIS — F411 Generalized anxiety disorder: Secondary | ICD-10-CM

## 2020-04-05 NOTE — Telephone Encounter (Signed)
Requested medication (s) are due for refill today: No  Requested medication (s) are on the active medication list: Yes  Last refill:  03/13/20  Future visit scheduled: No  Notes to clinic:  Pharmacy asking for diagnosis and 90 day supply. Pt. Needs OV. Left pt. Message to call and make appointment.    Requested Prescriptions  Pending Prescriptions Disp Refills   PARoxetine (PAXIL-CR) 25 MG 24 hr tablet [Pharmacy Med Name: PAROXETINE ER 25 MG TABLET] 90 tablet 1    Sig: TAKE 1 TABLET BY MOUTH EVERY DAY IN THE MORNING      Psychiatry:  Antidepressants - SSRI Failed - 04/05/2020 10:35 AM      Failed - Valid encounter within last 6 months    Recent Outpatient Visits           1 year ago Annual physical exam   [facility] Glean Hess, MD   1 year ago Influenza-like illness   [facility] Glean Hess, MD   2 years ago Annual physical exam   [facility] Glean Hess, MD   2 years ago 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

## 2020-04-09 ENCOUNTER — Ambulatory Visit: Payer: BC Managed Care – PPO | Admitting: Pain Medicine

## 2020-05-09 ENCOUNTER — Ambulatory Visit: Payer: BC Managed Care – PPO | Admitting: Pain Medicine

## 2020-05-15 DIAGNOSIS — M75121 Complete rotator cuff tear or rupture of right shoulder, not specified as traumatic: Secondary | ICD-10-CM | POA: Diagnosis not present

## 2020-05-15 DIAGNOSIS — M7551 Bursitis of right shoulder: Secondary | ICD-10-CM | POA: Diagnosis not present

## 2020-05-15 DIAGNOSIS — M25511 Pain in right shoulder: Secondary | ICD-10-CM | POA: Diagnosis not present

## 2020-05-15 DIAGNOSIS — M7541 Impingement syndrome of right shoulder: Secondary | ICD-10-CM | POA: Diagnosis not present

## 2020-05-17 DIAGNOSIS — Z23 Encounter for immunization: Secondary | ICD-10-CM | POA: Diagnosis not present

## 2020-06-08 ENCOUNTER — Other Ambulatory Visit: Payer: Self-pay | Admitting: Internal Medicine

## 2020-06-08 DIAGNOSIS — K2100 Gastro-esophageal reflux disease with esophagitis, without bleeding: Secondary | ICD-10-CM

## 2020-06-08 DIAGNOSIS — I10 Essential (primary) hypertension: Secondary | ICD-10-CM | POA: Diagnosis not present

## 2020-06-08 DIAGNOSIS — M7541 Impingement syndrome of right shoulder: Secondary | ICD-10-CM | POA: Diagnosis not present

## 2020-06-08 DIAGNOSIS — M25511 Pain in right shoulder: Secondary | ICD-10-CM | POA: Diagnosis not present

## 2020-06-08 DIAGNOSIS — L729 Follicular cyst of the skin and subcutaneous tissue, unspecified: Secondary | ICD-10-CM | POA: Diagnosis not present

## 2020-06-08 NOTE — Telephone Encounter (Signed)
Computer shut down, completed this process, disregard.

## 2020-06-08 NOTE — Telephone Encounter (Signed)
Requested medications are due for refill today yes  Requested medications are on the active medication list yes  Last refill 03/08/20  Last visit may 2020  Future visit scheduled no  Notes to clinic failed protocol of visit within 6 months, no upcoming appt scheduled.

## 2020-06-12 DIAGNOSIS — M791 Myalgia, unspecified site: Secondary | ICD-10-CM | POA: Diagnosis not present

## 2020-06-12 DIAGNOSIS — M7918 Myalgia, other site: Secondary | ICD-10-CM | POA: Diagnosis not present

## 2020-06-12 DIAGNOSIS — Z981 Arthrodesis status: Secondary | ICD-10-CM | POA: Diagnosis not present

## 2020-06-12 DIAGNOSIS — M75121 Complete rotator cuff tear or rupture of right shoulder, not specified as traumatic: Secondary | ICD-10-CM | POA: Diagnosis not present

## 2020-06-12 DIAGNOSIS — M5412 Radiculopathy, cervical region: Secondary | ICD-10-CM | POA: Diagnosis not present

## 2020-06-12 DIAGNOSIS — M961 Postlaminectomy syndrome, not elsewhere classified: Secondary | ICD-10-CM | POA: Diagnosis not present

## 2020-06-12 DIAGNOSIS — G894 Chronic pain syndrome: Secondary | ICD-10-CM | POA: Diagnosis not present

## 2020-06-14 DIAGNOSIS — X58XXXA Exposure to other specified factors, initial encounter: Secondary | ICD-10-CM | POA: Diagnosis not present

## 2020-06-14 DIAGNOSIS — M7551 Bursitis of right shoulder: Secondary | ICD-10-CM | POA: Diagnosis not present

## 2020-06-14 DIAGNOSIS — S43431A Superior glenoid labrum lesion of right shoulder, initial encounter: Secondary | ICD-10-CM | POA: Diagnosis not present

## 2020-06-14 DIAGNOSIS — M7521 Bicipital tendinitis, right shoulder: Secondary | ICD-10-CM | POA: Diagnosis not present

## 2020-06-14 DIAGNOSIS — Y929 Unspecified place or not applicable: Secondary | ICD-10-CM | POA: Diagnosis not present

## 2020-06-14 DIAGNOSIS — M778 Other enthesopathies, not elsewhere classified: Secondary | ICD-10-CM | POA: Diagnosis not present

## 2020-06-14 DIAGNOSIS — G8918 Other acute postprocedural pain: Secondary | ICD-10-CM | POA: Diagnosis not present

## 2020-06-14 DIAGNOSIS — Z981 Arthrodesis status: Secondary | ICD-10-CM | POA: Diagnosis not present

## 2020-06-14 DIAGNOSIS — K219 Gastro-esophageal reflux disease without esophagitis: Secondary | ICD-10-CM | POA: Diagnosis not present

## 2020-06-14 DIAGNOSIS — M75121 Complete rotator cuff tear or rupture of right shoulder, not specified as traumatic: Secondary | ICD-10-CM | POA: Diagnosis not present

## 2020-06-14 DIAGNOSIS — M65811 Other synovitis and tenosynovitis, right shoulder: Secondary | ICD-10-CM | POA: Diagnosis not present

## 2020-06-14 DIAGNOSIS — F419 Anxiety disorder, unspecified: Secondary | ICD-10-CM | POA: Diagnosis not present

## 2020-06-14 DIAGNOSIS — M25511 Pain in right shoulder: Secondary | ICD-10-CM | POA: Diagnosis not present

## 2020-06-21 DIAGNOSIS — M7541 Impingement syndrome of right shoulder: Secondary | ICD-10-CM | POA: Diagnosis not present

## 2020-06-21 DIAGNOSIS — M7551 Bursitis of right shoulder: Secondary | ICD-10-CM | POA: Diagnosis not present

## 2020-06-21 DIAGNOSIS — M25511 Pain in right shoulder: Secondary | ICD-10-CM | POA: Diagnosis not present

## 2020-06-21 DIAGNOSIS — M75121 Complete rotator cuff tear or rupture of right shoulder, not specified as traumatic: Secondary | ICD-10-CM | POA: Diagnosis not present

## 2020-06-27 DIAGNOSIS — I10 Essential (primary) hypertension: Secondary | ICD-10-CM | POA: Diagnosis not present

## 2020-06-27 DIAGNOSIS — R5383 Other fatigue: Secondary | ICD-10-CM | POA: Diagnosis not present

## 2020-06-27 DIAGNOSIS — T50995A Adverse effect of other drugs, medicaments and biological substances, initial encounter: Secondary | ICD-10-CM | POA: Diagnosis not present

## 2020-06-27 DIAGNOSIS — G4440 Drug-induced headache, not elsewhere classified, not intractable: Secondary | ICD-10-CM | POA: Diagnosis not present

## 2020-06-28 DIAGNOSIS — M7551 Bursitis of right shoulder: Secondary | ICD-10-CM | POA: Diagnosis not present

## 2020-06-28 DIAGNOSIS — M25511 Pain in right shoulder: Secondary | ICD-10-CM | POA: Diagnosis not present

## 2020-06-28 DIAGNOSIS — M75121 Complete rotator cuff tear or rupture of right shoulder, not specified as traumatic: Secondary | ICD-10-CM | POA: Diagnosis not present

## 2020-06-28 DIAGNOSIS — M7541 Impingement syndrome of right shoulder: Secondary | ICD-10-CM | POA: Diagnosis not present

## 2020-07-02 DIAGNOSIS — M79672 Pain in left foot: Secondary | ICD-10-CM | POA: Diagnosis not present

## 2020-07-02 DIAGNOSIS — M67472 Ganglion, left ankle and foot: Secondary | ICD-10-CM | POA: Diagnosis not present

## 2020-07-05 DIAGNOSIS — M47812 Spondylosis without myelopathy or radiculopathy, cervical region: Secondary | ICD-10-CM | POA: Diagnosis not present

## 2020-07-05 DIAGNOSIS — M4322 Fusion of spine, cervical region: Secondary | ICD-10-CM | POA: Diagnosis not present

## 2020-07-05 DIAGNOSIS — J929 Pleural plaque without asbestos: Secondary | ICD-10-CM | POA: Diagnosis not present

## 2020-07-05 DIAGNOSIS — Z981 Arthrodesis status: Secondary | ICD-10-CM | POA: Diagnosis not present

## 2020-07-05 DIAGNOSIS — J984 Other disorders of lung: Secondary | ICD-10-CM | POA: Diagnosis not present

## 2020-07-11 DIAGNOSIS — M75121 Complete rotator cuff tear or rupture of right shoulder, not specified as traumatic: Secondary | ICD-10-CM | POA: Diagnosis not present

## 2020-07-11 DIAGNOSIS — M7541 Impingement syndrome of right shoulder: Secondary | ICD-10-CM | POA: Diagnosis not present

## 2020-07-11 DIAGNOSIS — M7551 Bursitis of right shoulder: Secondary | ICD-10-CM | POA: Diagnosis not present

## 2020-07-11 DIAGNOSIS — M25511 Pain in right shoulder: Secondary | ICD-10-CM | POA: Diagnosis not present

## 2020-07-23 DIAGNOSIS — M7541 Impingement syndrome of right shoulder: Secondary | ICD-10-CM | POA: Diagnosis not present

## 2020-07-23 DIAGNOSIS — M75121 Complete rotator cuff tear or rupture of right shoulder, not specified as traumatic: Secondary | ICD-10-CM | POA: Diagnosis not present

## 2020-07-23 DIAGNOSIS — M7551 Bursitis of right shoulder: Secondary | ICD-10-CM | POA: Diagnosis not present

## 2020-07-23 DIAGNOSIS — M25511 Pain in right shoulder: Secondary | ICD-10-CM | POA: Diagnosis not present

## 2020-07-31 DIAGNOSIS — M7541 Impingement syndrome of right shoulder: Secondary | ICD-10-CM | POA: Diagnosis not present

## 2020-07-31 DIAGNOSIS — M75121 Complete rotator cuff tear or rupture of right shoulder, not specified as traumatic: Secondary | ICD-10-CM | POA: Diagnosis not present

## 2020-07-31 DIAGNOSIS — M7551 Bursitis of right shoulder: Secondary | ICD-10-CM | POA: Diagnosis not present

## 2020-07-31 DIAGNOSIS — M25511 Pain in right shoulder: Secondary | ICD-10-CM | POA: Diagnosis not present

## 2020-08-07 DIAGNOSIS — M542 Cervicalgia: Secondary | ICD-10-CM | POA: Diagnosis not present

## 2020-08-07 DIAGNOSIS — M25511 Pain in right shoulder: Secondary | ICD-10-CM | POA: Diagnosis not present

## 2020-08-07 DIAGNOSIS — G8929 Other chronic pain: Secondary | ICD-10-CM | POA: Diagnosis not present

## 2020-08-07 DIAGNOSIS — Z79899 Other long term (current) drug therapy: Secondary | ICD-10-CM | POA: Diagnosis not present

## 2020-08-07 DIAGNOSIS — M791 Myalgia, unspecified site: Secondary | ICD-10-CM | POA: Diagnosis not present

## 2020-08-07 DIAGNOSIS — M25512 Pain in left shoulder: Secondary | ICD-10-CM | POA: Diagnosis not present

## 2020-08-08 DIAGNOSIS — M7551 Bursitis of right shoulder: Secondary | ICD-10-CM | POA: Diagnosis not present

## 2020-08-08 DIAGNOSIS — M6281 Muscle weakness (generalized): Secondary | ICD-10-CM | POA: Diagnosis not present

## 2020-08-08 DIAGNOSIS — M75121 Complete rotator cuff tear or rupture of right shoulder, not specified as traumatic: Secondary | ICD-10-CM | POA: Diagnosis not present

## 2020-08-08 DIAGNOSIS — M7541 Impingement syndrome of right shoulder: Secondary | ICD-10-CM | POA: Diagnosis not present

## 2020-08-08 DIAGNOSIS — M25511 Pain in right shoulder: Secondary | ICD-10-CM | POA: Diagnosis not present

## 2020-08-13 DIAGNOSIS — M25511 Pain in right shoulder: Secondary | ICD-10-CM | POA: Diagnosis not present

## 2020-08-13 DIAGNOSIS — M6281 Muscle weakness (generalized): Secondary | ICD-10-CM | POA: Diagnosis not present

## 2020-08-13 DIAGNOSIS — M75121 Complete rotator cuff tear or rupture of right shoulder, not specified as traumatic: Secondary | ICD-10-CM | POA: Diagnosis not present

## 2020-08-13 DIAGNOSIS — M7551 Bursitis of right shoulder: Secondary | ICD-10-CM | POA: Diagnosis not present

## 2020-08-13 DIAGNOSIS — M7541 Impingement syndrome of right shoulder: Secondary | ICD-10-CM | POA: Diagnosis not present

## 2020-08-15 DIAGNOSIS — M25511 Pain in right shoulder: Secondary | ICD-10-CM | POA: Diagnosis not present

## 2020-08-15 DIAGNOSIS — M6281 Muscle weakness (generalized): Secondary | ICD-10-CM | POA: Diagnosis not present

## 2020-08-15 DIAGNOSIS — M75121 Complete rotator cuff tear or rupture of right shoulder, not specified as traumatic: Secondary | ICD-10-CM | POA: Diagnosis not present

## 2020-08-15 DIAGNOSIS — M7551 Bursitis of right shoulder: Secondary | ICD-10-CM | POA: Diagnosis not present

## 2021-09-25 ENCOUNTER — Ambulatory Visit (INDEPENDENT_AMBULATORY_CARE_PROVIDER_SITE_OTHER): Payer: BC Managed Care – PPO

## 2021-09-25 ENCOUNTER — Ambulatory Visit
Admission: RE | Admit: 2021-09-25 | Discharge: 2021-09-25 | Disposition: A | Discharge status: Home / Self Care | Payer: BC Managed Care – PPO | Source: Ambulatory Visit | Attending: Emergency Medicine | Admitting: Emergency Medicine

## 2021-09-25 ENCOUNTER — Other Ambulatory Visit: Payer: Self-pay

## 2021-09-25 VITALS — BP 164/102 | HR 73 | Temp 98.7°F | Resp 16 | Wt 230.0 lb

## 2021-09-25 DIAGNOSIS — Z87891 Personal history of nicotine dependence: Secondary | ICD-10-CM | POA: Insufficient documentation

## 2021-09-25 DIAGNOSIS — R030 Elevated blood-pressure reading, without diagnosis of hypertension: Secondary | ICD-10-CM | POA: Diagnosis not present

## 2021-09-25 DIAGNOSIS — R059 Cough, unspecified: Secondary | ICD-10-CM | POA: Insufficient documentation

## 2021-09-25 DIAGNOSIS — Z28311 Partially vaccinated for covid-19: Secondary | ICD-10-CM | POA: Insufficient documentation

## 2021-09-25 DIAGNOSIS — Z8616 Personal history of COVID-19: Secondary | ICD-10-CM | POA: Insufficient documentation

## 2021-09-25 DIAGNOSIS — Z20822 Contact with and (suspected) exposure to covid-19: Secondary | ICD-10-CM | POA: Diagnosis not present

## 2021-09-25 DIAGNOSIS — I10 Essential (primary) hypertension: Secondary | ICD-10-CM | POA: Insufficient documentation

## 2021-09-25 DIAGNOSIS — J209 Acute bronchitis, unspecified: Secondary | ICD-10-CM | POA: Diagnosis not present

## 2021-09-25 DIAGNOSIS — R0981 Nasal congestion: Secondary | ICD-10-CM | POA: Insufficient documentation

## 2021-09-25 LAB — RAPID INFLUENZA A&B ANTIGENS
Influenza A (ARMC): NEGATIVE
Influenza B (ARMC): NEGATIVE

## 2021-09-25 MED ORDER — AEROCHAMBER PLUS MISC: 2 refills | Status: AC

## 2021-09-25 MED ORDER — PREDNISONE 20 MG PO TABS: 40.0000 mg | ORAL_TABLET | Freq: Every day | ORAL | 0 refills | Status: AC

## 2021-09-25 MED ORDER — FLUTICASONE PROPIONATE 50 MCG/ACT NA SUSP: 2.0000 | Freq: Every day | NASAL | 0 refills | Status: AC

## 2021-09-25 NOTE — Discharge Instructions (Addendum)
Your x-ray was negative for pneumonia.  I am treating you with as a bronchitis with prednisone for 5 days.  2 puffs from your albuterol inhaler every 4 hours for 2 days, then every 6 hours for 2 days, then as needed.  You may back off on the albuterol if you start to feel better.'s continue saline nasal irrigation, Flonase, Mucinex.   Your blood pressure was also elevated today, but it could be because you are sick.  Monitor this and please follow-up with your doctor.

## 2021-09-25 NOTE — ED Provider Notes (Signed)
HPI  SUBJECTIVE:  David Kennedy is a 56 y.o. male who presents with 3 days of nonproductive cough, nasal congestion, fatigue, body aches, headaches, wheezing, shortness of breath.  No fevers, rhinorrhea, sinus pain or pressure, facial swelling, upper dental pain, sore throat, post nasal drip, loss of sense of smell or taste, nausea, vomiting, diarrhea, abdominal pain.  No known COVID or flu exposure.  He got only 1 dose of the COVID-vaccine.  He got this years flu vaccine.  No antibiotics in the past month.  He took an antipyretic within the past 6 hours.  He has tried over-the-counter cold and flu medication, Mucinex, albuterol 3-4 times a day and saline nasal irrigation. He normally only uses albuterol as needed.  The Mucinex, albuterol and saline nasal irrigation helped.   No aggravating factors.  He is able to sleep through the night without waking up coughing.  He has a past medical history of frequent sinusitis and is status post surgery.  He also has a history of hypertension, COVID in 2019.  He has a 20-year pack history of smoking, but he quit  2-1/2 years ago.  No history of pulmonary disease, diabetes.  PMD: [facility] [place]    Past Medical History:  Diagnosis Date   Anxiety    Arthritis    osteo - hips   Bone spur    Cervical radiculopathy 07/21/2018   GERD (gastroesophageal reflux disease)    Herniated intervertebral disc of lumbar spine    Sinusitis    Wears dentures    partial upper and lower    Past Surgical History:  Procedure Laterality Date   ANTERIOR CERVICAL DECOMP/DISCECTOMY FUSION N/A 07/21/2018   Procedure: ANTERIOR CERVICAL DECOMPRESSION/DISCECTOMY FUSION 3 LEVELS-C4-7;  Surgeon: Meade Maw, MD;  Location: [facility];  Service: Neurosurgery;  Laterality: N/A;   COLONOSCOPY WITH PROPOFOL N/A 02/26/2018   Procedure: COLONOSCOPY WITH PROPOFOL;  Surgeon: [name], MD;  Location: [facility];  Service: Gastroenterology;  Laterality: N/A;    ESOPHAGOGASTRODUODENOSCOPY (EGD) WITH PROPOFOL N/A 02/26/2018   Positive for Barrett's Esophagus. REPEAT 03/2021   ETHMOIDECTOMY Bilateral 08/08/2015   Procedure: TOTAL ETHMOIDECTOMY;  Surgeon: Carloyn Manner, MD;  Location: [place];  Service: ENT;  Laterality: Bilateral;   FRONTAL SINUS EXPLORATION Left 08/08/2015   Procedure: FRONTAL SINUS EXPLORATION;  Surgeon: Carloyn Manner, MD;  Location: [facility];  Service: ENT;  Laterality: Left;   IMAGE GUIDED SINUS SURGERY N/A 08/08/2015   Procedure: IMAGE GUIDED SINUS SURGERY;  Surgeon: Carloyn Manner, MD;  Location: [place];  Service: ENT;  Laterality: N/A;  GAVE DISK TO [facility] 12/8   MAXILLARY ANTROSTOMY Bilateral 08/08/2015   Procedure: MAXILLARY ANTROSTOMY;  Surgeon: Carloyn Manner, MD;  Location: [place];  Service: ENT;  Laterality: Bilateral;   MICROLARYNGOSCOPY     with biopsy   NASAL TURBINATE REDUCTION Bilateral 08/08/2015   Procedure: TURBINATE REDUCTION/SUBMUCOSAL RESECTION;  Surgeon: Carloyn Manner, MD;  Location: [place];  Service: ENT;  Laterality: Bilateral;   POSTERIOR CERVICAL FUSION/FORAMINOTOMY N/A 10/19/2019   Procedure: C4-7 POSTERIOR SPINAL FUSION;  Surgeon: Meade Maw, MD;  Location: [facility];  Service: Neurosurgery;  Laterality: N/A;   SEPTOPLASTY N/A 08/08/2015   Procedure: SEPTOPLASTY;  Surgeon: Carloyn Manner, MD;  Location: [place];  Service: ENT;  Laterality: N/A;   SPHENOIDECTOMY Left 08/08/2015   Procedure: Coralee Pesa;  Surgeon: Carloyn Manner, MD;  Location: [place];  Service: ENT;  Laterality: Left;   UPPER GASTROINTESTINAL ENDOSCOPY  2013   WISDOM TOOTH EXTRACTION      Family History  Problem Relation Age of Onset   Diabetes Mother    Arthritis Mother    Heart disease Father 26   Arthritis Father    Hyperlipidemia Father    Hypertension Father    Pancreatic cancer Brother     Social History   Tobacco  Use   Smoking status: Former    Packs/day: 1.00    Years: 30.00    Pack years: 30.00    Types: Cigarettes    Quit date: 07/07/2019    Years since quitting: 2.2   Smokeless tobacco: Never  Vaping Use   Vaping Use: Never used  Substance Use Topics   Alcohol use: Not Currently   Drug use: No    No current facility-administered medications for this encounter.  Current Outpatient Medications:    fluticasone (FLONASE) 50 MCG/ACT nasal spray, Place 2 sprays into both nostrils daily., Disp: 16 g, Rfl: 0   predniSONE (DELTASONE) 20 MG tablet, Take 2 tablets (40 mg total) by mouth daily with breakfast for 5 days., Disp: 10 tablet, Rfl: 0   Spacer/Aero-Holding Chambers (AEROCHAMBER PLUS) inhaler, Use with inhaler, Disp: 1 each, Rfl: 2   celecoxib (CELEBREX) 100 MG capsule, Take 1 capsule (100 mg total) by mouth 2 (two) times daily., Disp: 60 capsule, Rfl: 0   dexlansoprazole (DEXILANT) 60 MG capsule, Take 60 mg by mouth in the morning and at bedtime., Disp: , Rfl:    methocarbamol (ROBAXIN) 500 MG tablet, Take 1-2 tablets (500-1,000 mg total) by mouth every 6 (six) hours as needed for muscle spasms., Disp: 90 tablet, Rfl: 0   Multiple Vitamin (MULTIVITAMIN WITH MINERALS) TABS tablet, Take 1 tablet by mouth daily., Disp: , Rfl:    pantoprazole (PROTONIX) 40 MG tablet, TAKE 1 TABLET BY MOUTH EVERY DAY, Disp: 30 tablet, Rfl: 0   PARoxetine (PAXIL-CR) 25 MG 24 hr tablet, TAKE 1 TABLET BY MOUTH EVERY DAY IN THE MORNING, Disp: 30 tablet, Rfl: 0  Allergies  Allergen Reactions   Sodium Hypochlorite Rash   Propranolol     Headache Headache    Atorvastatin Other (See Comments)    Headache and fatigue    Ezetimibe Other (See Comments)    Joint pain    Pravachol [Pravastatin] Other (See Comments)    Pt can not recall SE     ROS  As noted in HPI.   Physical Exam  BP (!) 164/102 (BP Location: Left Arm)    Pulse 73    Temp 98.7 F (37.1 C) (Oral)    Resp 16    Wt 104.3 kg    SpO2 97%     BMI 33.97 kg/m   Constitutional: Well developed, well nourished, no acute distress Eyes:  EOMI, conjunctiva normal bilaterally HENT: Normocephalic, atraumatic,mucus membranes moist.  Mild nasal congestion.  Normal turbinates.  No sinus tenderness.  Limited view of oropharynx. Neck: Positive cervical lymphadenopathy.   Respiratory: Normal inspiratory effort, diffuse expiratory wheezing and rhonchi throughout all lung fields. no chest wall tenderness Cardiovascular: Normal rate, regular rhythm, no murmurs, rubs, gallop GI: nondistended skin: No rash, skin intact Musculoskeletal: no deformities Neurologic: Alert & oriented x 3, no focal neuro deficits Psychiatric: Speech and behavior appropriate   ED Course   Medications - No data to display  Orders Placed This Encounter  Procedures   Rapid Influenza A&B Antigens    Standing Status:   Standing    Number of Occurrences:  1   SARS CORONAVIRUS 2 (TAT 6-24 HRS) Nasopharyngeal Nasopharyngeal Swab    Standing Status:   Standing    Number of Occurrences:   1   DG Chest 2 View    Standing Status:   Standing    Number of Occurrences:   1    Order Specific Question:   Reason for Exam (SYMPTOM  OR DIAGNOSIS REQUIRED)    Answer:   cough r/o PNA   Droplet precaution    Standing Status:   Standing    Number of Occurrences:   1    Results for orders placed or performed during the hospital encounter of 09/25/21 (from the past 24 hour(s))  Rapid Influenza A&B Antigens     Status: None   Collection Time: 09/25/21  9:35 AM   Specimen: Respiratory  Result Value Ref Range   Influenza A ([facility]) NEGATIVE NEGATIVE   Influenza B ([facility]) NEGATIVE NEGATIVE   DG Chest 2 View  Result Date: 09/25/2021 CLINICAL DATA:  Cough. EXAM: CHEST - 2 VIEW COMPARISON:  July 07, 2016. FINDINGS: The heart size and mediastinal contours are within normal limits. Both lungs are clear. The visualized skeletal structures are unremarkable. IMPRESSION: No active  cardiopulmonary disease. Electronically Signed   By: Marijo Conception M.D.   On: 09/25/2021 09:49    ED Clinical Impression  1. Acute bronchitis, unspecified organism   2. Elevated blood pressure reading      ED Assessment/Plan  1.  Respiratory infection with wheezing -checking COVID, rapid flu, chest x-ray.  Concern for bronchitis.  If COVID, flu are positive, then patient qualifies for antivirals based on BMI above 30 and history of hypertension.  Will prescribe molnupiravir.  Reviewed imaging independently.  No pneumonia.  Normal chest x-ray.  See radiology report for full details.  Influenza negative.  [phone] if COVID is positive.  We will treat as a bronchitis with 40 mg of prednisone for 5 days, regularly scheduled albuterol with a spacer for the next 4 days.  He states that he does not need a prescription for albuterol, saline nasal irrigation, Flonase, continue Mucinex.  Follow-up with PMD if not getting any better in 3 days, ER if he gets worse  2.  Blood pressure noted.  Discussed this with patient.  He will need to follow-up with primary care provider with this.  Discussed labs, imaging, MDM, treatment plan, and plan for follow-up with patient. Discussed sn/sx that should prompt return to the ED. patient agrees with plan.   Meds ordered this encounter  Medications   predniSONE (DELTASONE) 20 MG tablet    Sig: Take 2 tablets (40 mg total) by mouth daily with breakfast for 5 days.    Dispense:  10 tablet    Refill:  0   Spacer/Aero-Holding Chambers (AEROCHAMBER PLUS) inhaler    Sig: Use with inhaler    Dispense:  1 each    Refill:  2    Please educate patient on use   fluticasone (FLONASE) 50 MCG/ACT nasal spray    Sig: Place 2 sprays into both nostrils daily.    Dispense:  16 g    Refill:  0      *This clinic note was created using [employer]. Therefore, there may be occasional mistakes despite careful proofreading.  ?    Melynda Ripple, MD 09/25/21 1034

## 2021-09-25 NOTE — ED Triage Notes (Signed)
Patient presents to Urgent Care with complaints of cough, chest congestion, and fatigue since Monday. Treating symptoms with mucinex and cold/flu.   Denies fever.

## 2021-09-26 LAB — SARS CORONAVIRUS 2 (TAT 6-24 HRS): SARS Coronavirus 2: NEGATIVE

## 2023-01-01 ENCOUNTER — Ambulatory Visit
Admission: RE | Admit: 2023-01-01 | Discharge: 2023-01-01 | Disposition: A | Discharge status: Home / Self Care | Payer: BC Managed Care – PPO | Source: Ambulatory Visit | Attending: Internal Medicine | Admitting: Internal Medicine

## 2023-01-01 VITALS — BP 157/94 | HR 82 | Temp 97.8°F | Resp 16

## 2023-01-01 DIAGNOSIS — M25561 Pain in right knee: Secondary | ICD-10-CM | POA: Diagnosis not present

## 2023-01-01 NOTE — ED Triage Notes (Signed)
Patient presents to [facility] for knee pain x 2 days. States he turned the wrong way and heard a pop sound. Taking Celebrex.

## 2023-01-01 NOTE — Discharge Instructions (Signed)
Recommend you seek evaluation at an orthopedic provider who can perform imaging to help determine the cause of your pain and provide treatment.

## 2023-01-01 NOTE — ED Provider Notes (Signed)
David Kennedy    CSN: [id number] Arrival date & time: 01/01/23  1914      History   Chief Complaint Chief Complaint  Patient presents with   Knee Injury    Entered by patient    HPI David Kennedy is a 57 y.o. male.   HPI  Patient presents to urgent care with complaint of knee pain x 2 days.  Patient states he was walking and "turned the wrong way" and heard and felt a popping sound.  He has been treating the discomfort with Celebrex.  He states he feels unstable.  Pain is on the outside of the right knee. Endorses difficulty bending the knee because of "swelling"  Endorses previous injury/pain treated with "cortisone" shots.  Past Medical History:  Diagnosis Date   Anxiety    Arthritis    osteo - hips   Bone spur    Cervical radiculopathy 07/21/2018   GERD (gastroesophageal reflux disease)    Herniated intervertebral disc of lumbar spine    Sinusitis    Wears dentures    partial upper and lower    Patient Active Problem List   Diagnosis Date Noted   Hx of colonic polyps 03/29/2018   Barrett's esophagus determined by endoscopy 03/01/2018   S/P cervical spinal fusion 12/18/2017   Dermatitis 06/02/2017   Dyslipidemia 01/27/2017   Gastroesophageal reflux disease 07/07/2016   Tobacco use disorder 07/07/2016   Generalized anxiety disorder 07/07/2016    Past Surgical History:  Procedure Laterality Date   ANTERIOR CERVICAL DECOMP/DISCECTOMY FUSION N/A 07/21/2018   Procedure: ANTERIOR CERVICAL DECOMPRESSION/DISCECTOMY FUSION 3 LEVELS-C4-7;  Surgeon: Venetia Night, MD;  Location: [facility];  Service: Neurosurgery;  Laterality: N/A;   COLONOSCOPY WITH PROPOFOL N/A 02/26/2018   Procedure: COLONOSCOPY WITH PROPOFOL;  Surgeon: Wyline Mood, MD;  Location: [facility] ENDOSCOPY;  Service: Gastroenterology;  Laterality: N/A;   ESOPHAGOGASTRODUODENOSCOPY (EGD) WITH PROPOFOL N/A 02/26/2018   Positive for Barrett's Esophagus. REPEAT 03/2021   ETHMOIDECTOMY Bilateral  08/08/2015   Procedure: TOTAL ETHMOIDECTOMY;  Surgeon: Bud Face, MD;  Location: [facility];  Service: ENT;  Laterality: Bilateral;   FRONTAL SINUS EXPLORATION Left 08/08/2015   Procedure: FRONTAL SINUS EXPLORATION;  Surgeon: Bud Face, MD;  Location: [facility];  Service: ENT;  Laterality: Left;   IMAGE GUIDED SINUS SURGERY N/A 08/08/2015   Procedure: IMAGE GUIDED SINUS SURGERY;  Surgeon: Bud Face, MD;  Location: [facility];  Service: ENT;  Laterality: N/A;  GAVE DISK TO [facility] 12/8   MAXILLARY ANTROSTOMY Bilateral 08/08/2015   Procedure: MAXILLARY ANTROSTOMY;  Surgeon: Bud Face, MD;  Location: [facility];  Service: ENT;  Laterality: Bilateral;   MICROLARYNGOSCOPY     with biopsy   NASAL TURBINATE REDUCTION Bilateral 08/08/2015   Procedure: TURBINATE REDUCTION/SUBMUCOSAL RESECTION;  Surgeon: Bud Face, MD;  Location: [facility];  Service: ENT;  Laterality: Bilateral;   POSTERIOR CERVICAL FUSION/FORAMINOTOMY N/A 10/19/2019   Procedure: C4-7 POSTERIOR SPINAL FUSION;  Surgeon: Venetia Night, MD;  Location: [facility];  Service: Neurosurgery;  Laterality: N/A;   SEPTOPLASTY N/A 08/08/2015   Procedure: SEPTOPLASTY;  Surgeon: Bud Face, MD;  Location: [facility];  Service: ENT;  Laterality: N/A;   SPHENOIDECTOMY Left 08/08/2015   Procedure: Selina Cooley;  Surgeon: Bud Face, MD;  Location: [facility];  Service: ENT;  Laterality: Left;   UPPER GASTROINTESTINAL ENDOSCOPY  2013   WISDOM TOOTH EXTRACTION         Home Medications    Prior to  Admission medications   Medication Sig Start Date End Date Taking? Authorizing Provider  celecoxib (CELEBREX) 100 MG capsule Take 1 capsule (100 mg total) by mouth 2 (two) times daily. 10/21/19   Ivar Drape, PA-C  dexlansoprazole (DEXILANT) 60 MG capsule Take 60 mg by mouth in the morning and at bedtime.    [provider]  fluticasone  (FLONASE) 50 MCG/ACT nasal spray Place 2 sprays into both nostrils daily. 09/25/21   Domenick Gong, MD  methocarbamol (ROBAXIN) 500 MG tablet Take 1-2 tablets (500-1,000 mg total) by mouth every 6 (six) hours as needed for muscle spasms. 10/21/19   Ivar Drape, PA-C  Multiple Vitamin (MULTIVITAMIN WITH MINERALS) TABS tablet Take 1 tablet by mouth daily.    [provider]  pantoprazole (PROTONIX) 40 MG tablet TAKE 1 TABLET BY MOUTH EVERY DAY 06/08/20   Reubin Milan, MD  PARoxetine (PAXIL-CR) 25 MG 24 hr tablet TAKE 1 TABLET BY MOUTH EVERY DAY IN THE MORNING 03/13/20   Reubin Milan, MD  Spacer/Aero-Holding Chambers (AEROCHAMBER PLUS) inhaler Use with inhaler 09/25/21   Domenick Gong, MD    Family History Family History  Problem Relation Age of Onset   Diabetes Mother    Arthritis Mother    Heart disease Father 80   Arthritis Father    Hyperlipidemia Father    Hypertension Father    Pancreatic cancer Brother     Social History Social History   Tobacco Use   Smoking status: Former    Packs/day: 1.00    Years: 30.00    Additional pack years: 0.00    Total pack years: 30.00    Types: Cigarettes    Quit date: 07/07/2019    Years since quitting: 3.4   Smokeless tobacco: Never  Vaping Use   Vaping Use: Never used  Substance Use Topics   Alcohol use: Not Currently   Drug use: No     Allergies   Sodium hypochlorite, Propranolol, Atorvastatin, Ezetimibe, and Pravachol [pravastatin]   Review of Systems Review of Systems   Physical Exam Triage Vital Signs ED Triage Vitals  Enc Vitals Group     BP 01/01/23 0904 (!) 157/94     Pulse Rate 01/01/23 0904 82     Resp 01/01/23 0904 16     Temp 01/01/23 0904 97.8 F (36.6 C)     Temp src --      SpO2 01/01/23 0904 97 %     Weight --      Height --      Head Circumference --      Peak Flow --      Pain Score 01/01/23 0905 5     Pain Loc --      Pain Edu? --      Excl. in GC? --    No data  found.  Updated Vital Signs BP (!) 157/94 (BP Location: Left Arm)   Pulse 82   Temp 97.8 F (36.6 C)   Resp 16   SpO2 97%   Visual Acuity Right Eye Distance:   Left Eye Distance:   Bilateral Distance:    Right Eye Near:   Left Eye Near:    Bilateral Near:     Physical Exam Vitals reviewed.  Constitutional:      Appearance: Normal appearance.  Musculoskeletal:     Right knee: Decreased range of motion. Tenderness present over the LCL.     Instability Tests: Anterior drawer test negative. Posterior drawer test negative. Medial  McMurray test negative and lateral McMurray test negative.       Legs:  Skin:    General: Skin is warm and dry.  Neurological:     General: No focal deficit present.     Mental Status: He is alert and oriented to person, place, and time.  Psychiatric:        Mood and Affect: Mood normal.        Behavior: Behavior normal.      UC Treatments / Results  Labs (all labs ordered are listed, but only abnormal results are displayed) Labs Reviewed - No data to display  EKG   Radiology No results found.  Procedures Procedures (including critical care time)  Medications Ordered in UC Medications - No data to display  Initial Impression / Assessment and Plan / UC Course  I have reviewed the triage vital signs and the nursing notes.  Pertinent labs & imaging results that were available during my care of the patient were reviewed by me and considered in my medical decision making (see chart for details).   Patient has decreased range of motion in the right knee due to pain lateral to the patella.  There is no instability noted based on anterior and positive draw, medial and lateral McMurray.  Recommend patient seek orthopedic evaluation and possible advanced imaging.  Reviewed chart history.  Counseled patient on potential for adverse effects with medications prescribed/recommended today, ER and return-to-clinic precautions discussed, patient  verbalized understanding and agreement with care plan.  Final Clinical Impressions(s) / UC Diagnoses   Final diagnoses:  None   Discharge Instructions   None    ED Prescriptions   None    PDMP not reviewed this encounter.   Charma Igo, [place] 01/01/23 [phone]
